# Patient Record
Sex: Male | Born: 1955 | Race: Black or African American | Hispanic: No | Marital: Married | Smoking: Former smoker
Health system: Southern US, Community
[De-identification: ages and names within clinical notes are randomized; demographics above are authoritative.]

## PROBLEM LIST (undated history)

## (undated) DIAGNOSIS — Z8673 Personal history of transient ischemic attack (TIA), and cerebral infarction without residual deficits: Secondary | ICD-10-CM

## (undated) DIAGNOSIS — Z87898 Personal history of other specified conditions: Secondary | ICD-10-CM

## (undated) DIAGNOSIS — M109 Gout, unspecified: Secondary | ICD-10-CM

## (undated) DIAGNOSIS — N182 Chronic kidney disease, stage 2 (mild): Secondary | ICD-10-CM

## (undated) DIAGNOSIS — R7303 Prediabetes: Secondary | ICD-10-CM

## (undated) DIAGNOSIS — Z86718 Personal history of other venous thrombosis and embolism: Secondary | ICD-10-CM

## (undated) HISTORY — DX: Gout, unspecified: M10.9

## (undated) HISTORY — DX: Personal history of other specified conditions: Z87.898

## (undated) HISTORY — PX: BIOPSY PROSTATE: PRO28

## (undated) HISTORY — DX: Personal history of transient ischemic attack (TIA), and cerebral infarction without residual deficits: Z86.73

## (undated) HISTORY — DX: Chronic kidney disease, stage 2 (mild): N18.2

## (undated) HISTORY — DX: Personal history of other venous thrombosis and embolism: Z86.718

## (undated) HISTORY — DX: Prediabetes: R73.03

---

## 2005-05-26 HISTORY — PX: COLONOSCOPY: SHX174

## 2005-05-26 LAB — HM COLONOSCOPY: HM Colonoscopy: NORMAL

## 2013-05-26 LAB — PSA: PSA: NORMAL

## 2015-02-07 ENCOUNTER — Ambulatory Visit (INDEPENDENT_AMBULATORY_CARE_PROVIDER_SITE_OTHER): Payer: 59

## 2015-02-07 DIAGNOSIS — Z111 Encounter for screening for respiratory tuberculosis: Secondary | ICD-10-CM

## 2015-02-07 MED ORDER — TUBERCULIN PPD 5 UNIT/0.1ML ID SOLN
5.0000 [IU] | Freq: Once | INTRADERMAL | Status: DC
  Administered 2015-02-07: 5 [IU] via INTRADERMAL

## 2015-02-12 ENCOUNTER — Ambulatory Visit (INDEPENDENT_AMBULATORY_CARE_PROVIDER_SITE_OTHER): Payer: 59

## 2015-02-12 DIAGNOSIS — Z111 Encounter for screening for respiratory tuberculosis: Secondary | ICD-10-CM | POA: Diagnosis not present

## 2015-02-14 ENCOUNTER — Ambulatory Visit: Payer: 59

## 2015-06-06 ENCOUNTER — Encounter: Payer: Self-pay | Admitting: Family Medicine

## 2015-06-06 ENCOUNTER — Ambulatory Visit (INDEPENDENT_AMBULATORY_CARE_PROVIDER_SITE_OTHER): Payer: BC Managed Care – PPO | Admitting: Family Medicine

## 2015-06-06 ENCOUNTER — Telehealth: Payer: Self-pay | Admitting: Family Medicine

## 2015-06-06 VITALS — BP 118/78 | HR 76 | Temp 98.2°F | Resp 16 | Ht 76.0 in | Wt 238.0 lb

## 2015-06-06 DIAGNOSIS — Z13220 Encounter for screening for lipoid disorders: Secondary | ICD-10-CM

## 2015-06-06 DIAGNOSIS — R361 Hematospermia: Secondary | ICD-10-CM | POA: Diagnosis not present

## 2015-06-06 DIAGNOSIS — Z87898 Personal history of other specified conditions: Secondary | ICD-10-CM | POA: Diagnosis not present

## 2015-06-06 DIAGNOSIS — Z1159 Encounter for screening for other viral diseases: Secondary | ICD-10-CM

## 2015-06-06 DIAGNOSIS — N182 Chronic kidney disease, stage 2 (mild): Secondary | ICD-10-CM

## 2015-06-06 DIAGNOSIS — R972 Elevated prostate specific antigen [PSA]: Secondary | ICD-10-CM

## 2015-06-06 DIAGNOSIS — R7303 Prediabetes: Secondary | ICD-10-CM | POA: Diagnosis not present

## 2015-06-06 DIAGNOSIS — Z86718 Personal history of other venous thrombosis and embolism: Secondary | ICD-10-CM | POA: Insufficient documentation

## 2015-06-06 DIAGNOSIS — Z8673 Personal history of transient ischemic attack (TIA), and cerebral infarction without residual deficits: Secondary | ICD-10-CM | POA: Insufficient documentation

## 2015-06-06 DIAGNOSIS — Z23 Encounter for immunization: Secondary | ICD-10-CM

## 2015-06-06 DIAGNOSIS — Z79899 Other long term (current) drug therapy: Secondary | ICD-10-CM

## 2015-06-06 LAB — POCT URINALYSIS DIPSTICK
BILIRUBIN UA: NEGATIVE
Glucose, UA: NEGATIVE
KETONES UA: NEGATIVE
LEUKOCYTES UA: NEGATIVE
NITRITE UA: NEGATIVE
PH UA: 5
PROTEIN UA: NEGATIVE
Spec Grav, UA: 1.01
Urobilinogen, UA: 0.2

## 2015-06-06 NOTE — Progress Notes (Signed)
Name: Nathaniel [name]   MRN: [id number]    DOB: [birth year]   Date:06/06/2015       Progress Note  Subjective  Chief Complaint  Chief Complaint  Patient presents with  . Advice Only    patient stated that he noticed that when he ejaculates there is dark red blood. no pain, no burning, no swelling.    HPI  Blood in semen: he states he noticed some specks of blood mixed in his semen since Saturday, every time he has ejaculated since ( total of 3 times ), no pain, no discomfort, no trauma. He takes aspirin and plavix because of history of TIA, but denies any bleeding in other areas.   Prediabetes : he denies polyphagia, polyuria or polyria   Patient Active Problem List   Diagnosis Date Noted  . CKD (chronic kidney disease) stage 2, GFR 60-89 ml/min 06/06/2015  . Prediabetes 06/06/2015  . History of TIA (transient ischemic attack) 06/06/2015  . History of elevated PSA   . History of DVT (deep vein thrombosis)     History reviewed. No pertinent past surgical history.  Family History  Problem Relation Age of Onset  . Kidney disease Mother     Dialysis  . Cancer Mother     Breast  . Diabetes Father   . Cancer Father 43    Prostate    Social History   Social History  . Marital Status: Married    Spouse Name: N/A  . Number of Children: N/A  . Years of Education: N/A   Occupational History  . Not on file.   Social History Main Topics  . Smoking status: Former Games developer  . Smokeless tobacco: Not on file  . Alcohol Use: 0.0 oz/week    0 Standard drinks or equivalent per week  . Drug Use: No  . Sexual Activity:    Partners: Female   Other Topics Concern  . Not on file   Social History Narrative     Current outpatient prescriptions:  .  aspirin 81 MG chewable tablet, Chew by mouth., Disp: , Rfl:  .  clopidogrel (PLAVIX) 75 MG tablet, Take by mouth., Disp: , Rfl:   No Known Allergies   ROS  Constitutional: Negative for fever or weight change.  Respiratory:  Negative for cough and shortness of breath.   Cardiovascular: Negative for chest pain or palpitations.  Gastrointestinal: Negative for abdominal pain, no bowel changes.  Musculoskeletal: Negative for gait problem or joint swelling.  Skin: Negative for rash.  Neurological: Negative for dizziness or headache.  No other specific complaints in a complete review of systems (except as listed in HPI above).  Objective  Filed Vitals:   06/06/15 1152  BP: 118/78  Pulse: 76  Temp: 98.2 F (36.8 C)  TempSrc: Oral  Resp: 16  Height: 6\' 4"  (1.93 m)  Weight: 238 lb (107.956 kg)  SpO2: 97%    Body mass index is 28.98 kg/(m^2).  Physical Exam  Constitutional: Patient appears well-developed and well-nourished. No distress.  HEENT: head atraumatic, normocephalic, pupils equal and reactive to light, neck supple, throat within normal limits Cardiovascular: Normal rate, regular rhythm and normal heart sounds.  No murmur heard. No BLE edema. Pulmonary/Chest: Effort normal and breath sounds normal. No respiratory distress. Abdominal: Soft.  There is no tenderness. Psychiatric: Patient has a normal mood and affect. behavior is normal. Judgment and thought content normal. GU: normal testicular and penile exam, prostate exam not done, referred to Urologist  PHQ2/9: Depression  screen PHQ 2/9 06/06/2015  Decreased Interest 0  Down, Depressed, Hopeless 0  PHQ - 2 Score 0    Fall Risk: Fall Risk  06/06/2015  Falls in the past year? Yes  Number falls in past yr: 1  Injury with Fall? Yes     Functional Status Survey: Is the patient deaf or have difficulty hearing?: No Does the patient have difficulty seeing, even when wearing glasses/contacts?: No Does the patient have difficulty concentrating, remembering, or making decisions?: No Does the patient have difficulty walking or climbing stairs?: No Does the patient have difficulty dressing or bathing?: No Does the patient have difficulty doing  errands alone such as visiting a doctor's office or shopping?: No    Assessment & Plan  1. Blood in semen  - CBC with Differential/Platelet - Ambulatory referral to Urology  2. Long-term use of high-risk medication  - CBC with Differential/Platelet  3. Needs flu shot  - Flu Vaccine QUAD 36+ mos IM  4. Need for Tdap vaccination  - Tdap vaccine greater than or equal to 7yo IM - he will get it during his CPE, out of stock  5. Need for shingles vaccine  - Varicella-zoster vaccine subcutaneous  - out of stock, he will get next time, out of stock today   6. History of elevated PSA  - PSA  7. Prediabetes  - Hemoglobin A1c  8. CKD (chronic kidney disease) stage 2, GFR 60-89 ml/min  - Comprehensive metabolic panel  9. Lipid screening  - Lipid panel  10. Need for hepatitis C screening test  - Hepatitis C antibody

## 2015-06-06 NOTE — Telephone Encounter (Signed)
Patient scheduled his annual cpe for Feb and he would like an order for his lab work.

## 2015-06-07 LAB — HEMOGLOBIN A1C
Est. average glucose Bld gHb Est-mCnc: 123 mg/dL
HEMOGLOBIN A1C: 5.9 % — AB (ref 4.8–5.6)

## 2015-06-07 LAB — CBC WITH DIFFERENTIAL/PLATELET
BASOS: 0 %
Basophils Absolute: 0 10*3/uL (ref 0.0–0.2)
EOS (ABSOLUTE): 0.1 10*3/uL (ref 0.0–0.4)
EOS: 3 %
HEMATOCRIT: 45.3 % (ref 37.5–51.0)
Hemoglobin: 15.6 g/dL (ref 12.6–17.7)
IMMATURE GRANS (ABS): 0 10*3/uL (ref 0.0–0.1)
Immature Granulocytes: 0 %
LYMPHS: 37 %
Lymphocytes Absolute: 1.4 10*3/uL (ref 0.7–3.1)
MCH: 30.4 pg (ref 26.6–33.0)
MCHC: 34.4 g/dL (ref 31.5–35.7)
MCV: 88 fL (ref 79–97)
Monocytes Absolute: 0.2 10*3/uL (ref 0.1–0.9)
Monocytes: 6 %
NEUTROS ABS: 2 10*3/uL (ref 1.4–7.0)
Neutrophils: 54 %
PLATELETS: 230 10*3/uL (ref 150–379)
RBC: 5.14 x10E6/uL (ref 4.14–5.80)
RDW: 13.7 % (ref 12.3–15.4)
WBC: 3.6 10*3/uL (ref 3.4–10.8)

## 2015-06-07 LAB — COMPREHENSIVE METABOLIC PANEL
A/G RATIO: 1.9 (ref 1.1–2.5)
ALBUMIN: 4.5 g/dL (ref 3.6–4.8)
ALT: 29 IU/L (ref 0–44)
AST: 27 IU/L (ref 0–40)
Alkaline Phosphatase: 75 IU/L (ref 39–117)
BUN / CREAT RATIO: 11 (ref 10–22)
BUN: 13 mg/dL (ref 8–27)
Bilirubin Total: 0.5 mg/dL (ref 0.0–1.2)
CALCIUM: 9.7 mg/dL (ref 8.6–10.2)
CO2: 26 mmol/L (ref 18–29)
CREATININE: 1.16 mg/dL (ref 0.76–1.27)
Chloride: 99 mmol/L (ref 96–106)
GFR, EST AFRICAN AMERICAN: 79 mL/min/{1.73_m2} (ref >59)
GFR, EST NON AFRICAN AMERICAN: 68 mL/min/{1.73_m2} (ref >59)
GLOBULIN, TOTAL: 2.4 g/dL (ref 1.5–4.5)
Glucose: 93 mg/dL (ref 65–99)
POTASSIUM: 4.6 mmol/L (ref 3.5–5.2)
SODIUM: 140 mmol/L (ref 134–144)
TOTAL PROTEIN: 6.9 g/dL (ref 6.0–8.5)

## 2015-06-07 LAB — HEPATITIS C ANTIBODY: Hep C Virus Ab: <0.1 s/co ratio (ref 0.0–0.9)

## 2015-06-07 LAB — LIPID PANEL
CHOL/HDL RATIO: 3.2 ratio (ref 0.0–5.0)
Cholesterol, Total: 186 mg/dL (ref 100–199)
HDL: 59 mg/dL (ref >39)
LDL CALC: 99 mg/dL (ref 0–99)
TRIGLYCERIDES: 141 mg/dL (ref 0–149)
VLDL Cholesterol Cal: 28 mg/dL (ref 5–40)

## 2015-06-07 LAB — PSA: Prostate Specific Ag, Serum: 2.2 ng/mL (ref 0.0–4.0)

## 2015-06-08 ENCOUNTER — Telehealth: Payer: Self-pay

## 2015-06-08 NOTE — Telephone Encounter (Signed)
Patient was informed of result and a eating plan for prediabetes was printed out for him to pick up at his convenience.

## 2015-06-11 ENCOUNTER — Encounter: Payer: Self-pay | Admitting: Urology

## 2015-06-11 ENCOUNTER — Ambulatory Visit (INDEPENDENT_AMBULATORY_CARE_PROVIDER_SITE_OTHER): Payer: BC Managed Care – PPO | Admitting: Urology

## 2015-06-11 VITALS — BP 118/77 | HR 69 | Ht 76.0 in | Wt 231.6 lb

## 2015-06-11 DIAGNOSIS — Z87898 Personal history of other specified conditions: Secondary | ICD-10-CM

## 2015-06-11 DIAGNOSIS — R361 Hematospermia: Secondary | ICD-10-CM | POA: Diagnosis not present

## 2015-06-11 LAB — URINALYSIS, COMPLETE
Bilirubin, UA: NEGATIVE
Glucose, UA: NEGATIVE
Ketones, UA: NEGATIVE
Leukocytes, UA: NEGATIVE
NITRITE UA: NEGATIVE
Protein, UA: NEGATIVE
Specific Gravity, UA: <1.005 — ABNORMAL LOW (ref 1.005–1.030)
UUROB: 0.2 mg/dL (ref 0.2–1.0)
pH, UA: 6 (ref 5.0–7.5)

## 2015-06-11 LAB — MICROSCOPIC EXAMINATION
Bacteria, UA: NONE SEEN
EPITHELIAL CELLS (NON RENAL): NONE SEEN /HPF (ref 0–10)
WBC UA: NONE SEEN /HPF (ref 0–?)

## 2015-06-11 NOTE — Progress Notes (Signed)
06/11/2015 10:32 AM   Nathaniel [name] Aug 30, 1955 [id number]  Referring provider: Edwena FeltyAshany Sundaram, MD [address] [place], [place] [id number]  Chief Complaint  Patient presents with  . New Patient (Initial Visit)    blood in semen, pt was seen x 4 yrs [place]    HPI:  1 - Hematospermia - pt wit blood in semen noted 05/2015. No new discharge, voiding complaints, GU trauma. No hematuria, non-smoker (UA w/o hematuria as well). DRE 30gm smooth, scrotal exam unremarkable. Now slowly resolving. He is on plavix for h/o DVT, TIA.  2 - Elevated PSA - s/p negative BX around 2012 for single elevated PSA, that in retrospect he states was drawn after intercourse. Pr's father with prosate cancer. Recent Screening: 05/2015 PSA 2.2 / DRE 30gm smooth.  PMH sig for DVT,TIA (now on plavix).   Today "Nathaniel CliffRicky" is seen as new patient for above.   PMH: Past Medical History  Diagnosis Date  . History of elevated PSA     [place] Prostate Biopsy Benign (Specialist wanted to do biopsies q6 months but patient did not think this was necessary and would rather monitor with annual PSA's)  . History of DVT (deep vein thrombosis)     DVT right lower extremity in 2003 on coumadin for 3 months, INR levels subtherapeutic, started having slurred words and memory issues, extensive TIA/CVA work up done, no residual deficits. Switched to asa plus plavix ever since and has done well thus far.  . CKD (chronic kidney disease) stage 2, GFR 60-89 ml/min   . CKD (chronic kidney disease) stage 2, GFR 60-89 ml/min   . Pre-diabetes   . History of TIA (transient ischemic attack)     Surgical History: Past Surgical History  Procedure Laterality Date  . No past surgeries    . Biopsy prostate  ?2012    neg    Home Medications:    Medication List       This list is accurate as of: 06/11/15 10:32 AM.  Always use your most recent med list.               aspirin 81 MG chewable tablet  Chew by mouth.     clopidogrel 75 MG tablet  Commonly known as:  PLAVIX  Take by mouth.        Allergies: No Known Allergies  Family History: Family History  Problem Relation Age of Onset  . Kidney disease Mother     Dialysis  . Cancer Mother     Breast  . Diabetes Father   . Cancer Father [phone]    Prostate    Social History:  reports that he quit smoking about 26 years ago. He does not have any smokeless tobacco history on file. He reports that he drinks alcohol. He reports that he does not use illicit drugs.  ROS: UROLOGY Frequent Urination?: No Hard to postpone urination?: No Burning/pain with urination?: No Get up at night to urinate?: Yes Leakage of urine?: No Urine stream starts and stops?: No Trouble starting stream?: No Do you have to strain to urinate?: No Blood in urine?: No Urinary tract infection?: No Sexually transmitted disease?: Yes (1979) Injury to kidneys or bladder?: No Painful intercourse?: No Weak stream?: No Erection problems?: Yes (sometimes) Penile pain?: No  Gastrointestinal Nausea?: No Vomiting?: No Indigestion/heartburn?: No Diarrhea?: No Constipation?: No  Constitutional Fever: No Night sweats?: No Weight loss?: No Fatigue?: No  Skin Skin rash/lesions?: No Itching?: No  Eyes Blurred vision?: No Double vision?: No  Ears/Nose/Throat Sore throat?: No Sinus problems?: No  Hematologic/Lymphatic Swollen glands?: No Easy bruising?: Yes  Cardiovascular Leg swelling?: No Chest pain?: No  Respiratory Cough?: No Shortness of breath?: No  Endocrine Excessive thirst?: No  Musculoskeletal Back pain?: Yes Joint pain?: No  Neurological Headaches?: No Dizziness?: No  Psychologic Depression?: No Anxiety?: No  Physical Exam: BP 118/77 mmHg  Pulse 69  Ht 6\' 4"  (1.93 m)  Wt 231 lb 9.6 oz (105.053 kg)  BMI 28.20 kg/m2  Constitutional:  Alert and oriented, No acute distress. HEENT:  [place] AT, moist mucus membranes.  Trachea midline, no masses. Cardiovascular: No clubbing, cyanosis, or edema. Respiratory: Normal respiratory effort, no increased work of breathing. GI: Abdomen is soft, nontender, nondistended, no abdominal masses GU: No CVA tenderness. No testes masses. DRE 30gm smooth. Circ'd, straight phallus.  Skin: No rashes, bruises or suspicious lesions. Lymph: No cervical or inguinal adenopathy. Neurologic: Grossly intact, no focal deficits, moving all 4 extremities. Psychiatric: Normal mood and affect.  Laboratory Data: Lab Results  Component Value Date   WBC 3.6 06/06/2015   HCT 45.3 06/06/2015   MCV 88 06/06/2015   PLT 230 06/06/2015    Lab Results  Component Value Date   CREATININE 1.16 06/06/2015    Lab Results  Component Value Date   PSA Normal 05/26/2013    No results found for: TESTOSTERONE  Lab Results  Component Value Date   HGBA1C 5.9* 06/06/2015    Urinalysis    Component Value Date/Time   BILIRUBINUR NEGATIVE 06/06/2015 1221   PROTEINUR NEGATIVE 06/06/2015 1221   UROBILINOGEN 0.2 06/06/2015 1221   NITRITE NEGATIVE 06/06/2015 1221   LEUKOCYTESUR Negative 06/06/2015 1221    Pertinent Imaging: none  Assessment & Plan:    1 - Hematospermia - pt reassured. Natural history (nearly always benign, sometimes recurrent) and abbreviated eval with UCX, scrotal US, PSA discussed. Also discussed that if recurrent / refractory more detail eval with CT, Cysto and daily finasteride may be appropriate.   PSA / DRE normal. UCX today and scrotal US on return, if normal, then prn management unless recurrent.   2 - Elevated PSA - up to date this year and compliant, certainly warrants continued annual screennig.   3 - RTC 3mos with scrotal US, then prn if stable.      Return in about 3 months (around 09/09/2015).  Nathaniel Ache, MD  [facility] [address] [place], [place] [address] [phone]

## 2015-06-12 ENCOUNTER — Other Ambulatory Visit: Payer: Self-pay | Admitting: Family Medicine

## 2015-06-12 DIAGNOSIS — N182 Chronic kidney disease, stage 2 (mild): Secondary | ICD-10-CM

## 2015-06-12 DIAGNOSIS — Z87898 Personal history of other specified conditions: Secondary | ICD-10-CM

## 2015-06-12 DIAGNOSIS — R7303 Prediabetes: Secondary | ICD-10-CM

## 2015-06-12 DIAGNOSIS — R5383 Other fatigue: Secondary | ICD-10-CM

## 2015-06-12 NOTE — Telephone Encounter (Signed)
Lab work printed out and ready for pick up, having them drawn fasting for at least 8 hrs.

## 2015-06-13 LAB — URINE CULTURE: Organism ID, Bacteria: NO GROWTH

## 2015-06-14 ENCOUNTER — Other Ambulatory Visit: Payer: Self-pay

## 2015-06-14 DIAGNOSIS — R361 Hematospermia: Secondary | ICD-10-CM

## 2015-07-03 ENCOUNTER — Encounter: Payer: BC Managed Care – PPO | Admitting: Family Medicine

## 2015-07-05 ENCOUNTER — Encounter: Payer: Self-pay | Admitting: Family Medicine

## 2015-07-05 LAB — CBC WITH DIFFERENTIAL/PLATELET
BASOS ABS: 0 10*3/uL (ref 0.0–0.2)
BASOS: 1 %
EOS (ABSOLUTE): 0.1 10*3/uL (ref 0.0–0.4)
Eos: 4 %
HEMOGLOBIN: 14.8 g/dL (ref 12.6–17.7)
Hematocrit: 43.3 % (ref 37.5–51.0)
IMMATURE GRANS (ABS): 0 10*3/uL (ref 0.0–0.1)
IMMATURE GRANULOCYTES: 0 %
LYMPHS: 40 %
Lymphocytes Absolute: 1.5 10*3/uL (ref 0.7–3.1)
MCH: 29.4 pg (ref 26.6–33.0)
MCHC: 34.2 g/dL (ref 31.5–35.7)
MCV: 86 fL (ref 79–97)
MONOCYTES: 7 %
Monocytes Absolute: 0.2 10*3/uL (ref 0.1–0.9)
NEUTROS PCT: 48 %
Neutrophils Absolute: 1.8 10*3/uL (ref 1.4–7.0)
PLATELETS: 218 10*3/uL (ref 150–379)
RBC: 5.04 x10E6/uL (ref 4.14–5.80)
RDW: 14.6 % (ref 12.3–15.4)
WBC: 3.7 10*3/uL (ref 3.4–10.8)

## 2015-07-05 LAB — LIPID PANEL
CHOLESTEROL TOTAL: 173 mg/dL (ref 100–199)
Chol/HDL Ratio: 2.7 ratio units (ref 0.0–5.0)
HDL: 64 mg/dL (ref >39)
LDL CALC: 94 mg/dL (ref 0–99)
TRIGLYCERIDES: 74 mg/dL (ref 0–149)
VLDL CHOLESTEROL CAL: 15 mg/dL (ref 5–40)

## 2015-07-05 LAB — COMPREHENSIVE METABOLIC PANEL
ALBUMIN: 4.4 g/dL (ref 3.6–4.8)
ALT: 25 IU/L (ref 0–44)
AST: 32 IU/L (ref 0–40)
Albumin/Globulin Ratio: 1.8 (ref 1.1–2.5)
Alkaline Phosphatase: 70 IU/L (ref 39–117)
BUN/Creatinine Ratio: 10 (ref 10–22)
BUN: 12 mg/dL (ref 8–27)
Bilirubin Total: 0.8 mg/dL (ref 0.0–1.2)
CALCIUM: 9.7 mg/dL (ref 8.6–10.2)
CO2: 25 mmol/L (ref 18–29)
CREATININE: 1.22 mg/dL (ref 0.76–1.27)
Chloride: 103 mmol/L (ref 96–106)
GFR, EST AFRICAN AMERICAN: 74 mL/min/{1.73_m2} (ref >59)
GFR, EST NON AFRICAN AMERICAN: 64 mL/min/{1.73_m2} (ref >59)
GLUCOSE: 91 mg/dL (ref 65–99)
Globulin, Total: 2.5 g/dL (ref 1.5–4.5)
Potassium: 4.9 mmol/L (ref 3.5–5.2)
Sodium: 143 mmol/L (ref 134–144)
TOTAL PROTEIN: 6.9 g/dL (ref 6.0–8.5)

## 2015-07-05 LAB — HEMOGLOBIN A1C
Est. average glucose Bld gHb Est-mCnc: 120 mg/dL
Hgb A1c MFr Bld: 5.8 % — ABNORMAL HIGH (ref 4.8–5.6)

## 2015-07-05 LAB — TSH: TSH: 2.18 u[IU]/mL (ref 0.450–4.500)

## 2015-07-05 LAB — PSA: Prostate Specific Ag, Serum: 7 ng/mL — ABNORMAL HIGH (ref 0.0–4.0)

## 2015-07-06 ENCOUNTER — Ambulatory Visit (INDEPENDENT_AMBULATORY_CARE_PROVIDER_SITE_OTHER): Payer: BC Managed Care – PPO | Admitting: Family Medicine

## 2015-07-06 ENCOUNTER — Encounter: Payer: Self-pay | Admitting: Family Medicine

## 2015-07-06 VITALS — BP 132/80 | HR 69 | Temp 97.7°F | Resp 14 | Ht 76.0 in | Wt 232.7 lb

## 2015-07-06 DIAGNOSIS — M545 Low back pain, unspecified: Secondary | ICD-10-CM | POA: Insufficient documentation

## 2015-07-06 DIAGNOSIS — R972 Elevated prostate specific antigen [PSA]: Secondary | ICD-10-CM | POA: Diagnosis not present

## 2015-07-06 DIAGNOSIS — Z1211 Encounter for screening for malignant neoplasm of colon: Secondary | ICD-10-CM | POA: Insufficient documentation

## 2015-07-06 DIAGNOSIS — Z23 Encounter for immunization: Secondary | ICD-10-CM | POA: Insufficient documentation

## 2015-07-06 DIAGNOSIS — Z0000 Encounter for general adult medical examination without abnormal findings: Secondary | ICD-10-CM | POA: Diagnosis not present

## 2015-07-06 DIAGNOSIS — Z8673 Personal history of transient ischemic attack (TIA), and cerebral infarction without residual deficits: Secondary | ICD-10-CM | POA: Diagnosis not present

## 2015-07-06 DIAGNOSIS — Z7189 Other specified counseling: Secondary | ICD-10-CM | POA: Insufficient documentation

## 2015-07-06 MED ORDER — CLOPIDOGREL BISULFATE 75 MG PO TABS: 75.0000 mg | ORAL_TABLET | Freq: Every day | ORAL | Status: DC

## 2015-07-06 NOTE — Progress Notes (Signed)
Name: Nathaniel [name]   MRN: [id number]    DOB: 1955/12/30   Date:07/06/2015       Progress Note  Subjective  Chief Complaint  Chief Complaint  Patient presents with  . Annual Exam    HPI  Patient is here today for a Complete Male Physical Exam:  The patient has no acute symptoms but would like to review recent lab work. Overall feels healthy. Diet is well balanced. In general does exercise regularly. Sees dentist regularly and addresses vision concerns with ophthalmologist if applicable. In regards to sexual activity the patient is currently sexually active. Currently is not concerned about exposure to any STDs.   Mr. Fahs did have elevated PSA on lab work done 07/04/15 as a part of his annual physical. Around 2012 he did have a similar single elevation of PSA and a prostate biopsy was negative at that time. He recently consulted with local urology specialist regarding hematospermia and PSA on 06/06/15 was 2.2. He did have a DRE that day on 06/01/15 which was normal per review of notes. Sexual intercourse prior to blood draw. Denies any current dysuria, change in urinary stream, ongoing issues with hematospermia.   Otherwise needs refill of Plavix for history of DVT/TIA.   Past Medical History  Diagnosis Date  . History of elevated PSA     Has seen Urologist in [place]: Prostate Biopsy Benign (Specialist wanted to do biopsies q6 months but patient did not think this was necessary and would rather monitor with annual PSA's)  . History of DVT (deep vein thrombosis)     DVT right lower extremity in 2003 on coumadin for 3 months, INR levels subtherapeutic, started having slurred words and memory issues, extensive TIA/CVA work up done, no residual deficits. Switched to asa plus plavix ever since and has done well thus far.  . CKD (chronic kidney disease) stage 2, GFR 60-89 ml/min   . CKD (chronic kidney disease) stage 2, GFR 60-89 ml/min   . Pre-diabetes   . History of TIA (transient ischemic attack)      Past Surgical History  Procedure Laterality Date  . No past surgeries    . Biopsy prostate  ?2012    neg    Family History  Problem Relation Age of Onset  . Kidney disease Mother     Dialysis  . Cancer Mother     Breast  . Diabetes Father   . Cancer Father 74    Prostate    Social History   Social History  . Marital Status: Married    Spouse Name: N/A  . Number of Children: N/A  . Years of Education: N/A   Occupational History  . Not on file.   Social History Main Topics  . Smoking status: Former Smoker -- 3 years    Quit date: 06/10/1989  . Smokeless tobacco: Not on file  . Alcohol Use: 0.0 oz/week    0 Standard drinks or equivalent per week  . Drug Use: No  . Sexual Activity:    Partners: Female   Other Topics Concern  . Not on file   Social History Narrative     Current outpatient prescriptions:  .  aspirin 81 MG chewable tablet, Chew by mouth., Disp: , Rfl:  .  clopidogrel (PLAVIX) 75 MG tablet, Take by mouth., Disp: , Rfl:   No Known Allergies  ROS  CONSTITUTIONAL: No significant weight changes, fever, chills, weakness or fatigue.  HEENT:  - Eyes: No visual changes.  -  Ears: No auditory changes. No pain.  - Nose: No sneezing, congestion, runny nose. - Throat: No sore throat. No changes in swallowing. SKIN: No rash or itching.  CARDIOVASCULAR: No chest pain, chest pressure or chest discomfort. No palpitations or edema.  RESPIRATORY: No shortness of breath, cough or sputum.  GASTROINTESTINAL: No anorexia, nausea, vomiting. No changes in bowel habits. No abdominal pain or blood.  GENITOURINARY: No dysuria. No frequency. No discharge.  NEUROLOGICAL: No headache, dizziness, syncope, paralysis, ataxia, numbness or tingling in the extremities. No memory changes. No change in bowel or bladder control.  MUSCULOSKELETAL: No joint pain. No muscle pain. HEMATOLOGIC: No anemia, bleeding or bruising.  LYMPHATICS: No enlarged lymph nodes.  PSYCHIATRIC:  No change in mood. No change in sleep pattern.  ENDOCRINOLOGIC: No reports of sweating, cold or heat intolerance. No polyuria or polydipsia.   Objective  Filed Vitals:   07/06/15 1412  BP: 132/80  Pulse: 69  Temp: 97.7 F (36.5 C)  TempSrc: Oral  Resp: 14  Height:  (1.93 m)  Weight: 232 lb 11.2 oz (105.552 kg)  SpO2: 94%   Body mass index is 28.34 kg/(m^2).  Depression screen [facility] 2/9 06/06/2015  Decreased Interest 0  Down, Depressed, Hopeless 0  PHQ - 2 Score 0    Recent Results (from the past 2160 hour(s))  POCT Urinalysis Dipstick     Status: Normal   Collection Time: 06/06/15 12:21 PM  Result Value Ref Range   Color, UA YELLOW    Clarity, UA CLEAR    Glucose, UA NEGATIVE    Bilirubin, UA NEGATIVE    Ketones, UA NEGATIVE    Spec Grav, UA 1.010    Blood, UA HEMOLYZED TRACE    pH, UA 5.0    Protein, UA NEGATIVE    Urobilinogen, UA 0.2    Nitrite, UA NEGATIVE    Leukocytes, UA Negative Negative  CBC with Differential/Platelet     Status: None   Collection Time: 06/06/15 12:41 PM  Result Value Ref Range   WBC 3.6 3.4 - 10.8 x10E3/uL   RBC 5.14 4.14 - 5.80 x10E6/uL   Hemoglobin 15.6 12.6 - 17.7 g/dL   Hematocrit 40.9 81.1 - 51.0 %   MCV 88 79 - 97 fL   MCH 30.4 26.6 - 33.0 pg   MCHC 34.4 31.5 - 35.7 g/dL   RDW 91.4 78.2 - 95.6 %   Platelets 230 150 - 379 x10E3/uL   Neutrophils 54 %   Lymphs 37 %   Monocytes 6 %   Eos 3 %   Basos 0 %   Neutrophils Absolute 2.0 1.4 - 7.0 x10E3/uL   Lymphocytes Absolute 1.4 0.7 - 3.1 x10E3/uL   Monocytes Absolute 0.2 0.1 - 0.9 x10E3/uL   EOS (ABSOLUTE) 0.1 0.0 - 0.4 x10E3/uL   Basophils Absolute 0.0 0.0 - 0.2 x10E3/uL   Immature Granulocytes 0 %   Immature Grans (Abs) 0.0 0.0 - 0.1 x10E3/uL  Comprehensive metabolic panel     Status: None   Collection Time: 06/06/15 12:41 PM  Result Value Ref Range   Glucose 93 65 - 99 mg/dL   BUN 13 8 - 27 mg/dL   Creatinine, Ser 2.13 0.76 - 1.27 mg/dL   GFR calc non Af Amer 68 >59  mL/min/1.73   GFR calc Af Amer 79 >59 mL/min/1.73   BUN/Creatinine Ratio 11 10 - 22   Sodium 140 134 - 144 mmol/L   Potassium 4.6 3.5 - 5.2 mmol/L   Chloride 99  96 - 106 mmol/L   CO2 26 18 - 29 mmol/L   Calcium 9.7 8.6 - 10.2 mg/dL   Total Protein 6.9 6.0 - 8.5 g/dL   Albumin 4.5 3.6 - 4.8 g/dL   Globulin, Total 2.4 1.5 - 4.5 g/dL   Albumin/Globulin Ratio 1.9 1.1 - 2.5   Bilirubin Total 0.5 0.0 - 1.2 mg/dL   Alkaline Phosphatase 75 39 - 117 IU/L   AST 27 0 - 40 IU/L   ALT 29 0 - 44 IU/L  Lipid panel     Status: None   Collection Time: 06/06/15 12:41 PM  Result Value Ref Range   Cholesterol, Total 186 100 - 199 mg/dL   Triglycerides 409 0 - 149 mg/dL   HDL 59 >81 mg/dL   VLDL Cholesterol Cal 28 5 - 40 mg/dL   LDL Calculated 99 0 - 99 mg/dL   Chol/HDL Ratio 3.2 0.0 - 5.0 ratio units    Comment:                                   T. Chol/HDL Ratio                                             Men  Women                               1/2 Avg.Risk  3.4    3.3                                   Avg.Risk  5.0    4.4                                2X Avg.Risk  9.6    7.1                                3X Avg.Risk 23.4   11.0   Hemoglobin A1c     Status: Abnormal   Collection Time: 06/06/15 12:41 PM  Result Value Ref Range   Hgb A1c MFr Bld 5.9 (H) 4.8 - 5.6 %    Comment:          Pre-diabetes: 5.7 - 6.4          Diabetes: >6.4          Glycemic control for adults with diabetes: <7.0    Est. average glucose Bld gHb Est-mCnc 123 mg/dL  PSA     Status: None   Collection Time: 06/06/15 12:41 PM  Result Value Ref Range   Prostate Specific Ag, Serum 2.2 0.0 - 4.0 ng/mL    Comment: Roche ECLIA methodology. According to the American Urological Association, Serum PSA should decrease and remain at undetectable levels after radical prostatectomy. The AUA defines biochemical recurrence as an initial PSA value 0.2 ng/mL or greater followed by a subsequent confirmatory PSA value 0.2 ng/mL or  greater. Values obtained with different assay methods or kits cannot be used interchangeably. Results cannot be interpreted as absolute evidence of the presence or absence of  malignant disease.   Hepatitis C antibody     Status: None   Collection Time: 06/06/15 12:41 PM  Result Value Ref Range   Hep C Virus Ab <0.1 0.0 - 0.9 s/co ratio    Comment:                                   Negative:     < 0.8                              Indeterminate: 0.8 - 0.9                                   Positive:     > 0.9  The CDC recommends that a positive HCV antibody result  be followed up with a HCV Nucleic Acid Amplification  test (161096).   Urinalysis, Complete     Status: Abnormal   Collection Time: 06/11/15 10:13 AM  Result Value Ref Range   Specific Gravity, UA <1.005 (L) 1.005 - 1.030   pH, UA 6.0 5.0 - 7.5   Color, UA Yellow Yellow   Appearance Ur Clear Clear   Leukocytes, UA Negative Negative   Protein, UA Negative Negative/Trace   Glucose, UA Negative Negative   Ketones, UA Negative Negative   RBC, UA 1+ (A) Negative   Bilirubin, UA Negative Negative   Urobilinogen, Ur 0.2 0.2 - 1.0 mg/dL   Nitrite, UA Negative Negative   Microscopic Examination See below:   Microscopic Examination     Status: None   Collection Time: 06/11/15 10:13 AM  Result Value Ref Range   WBC, UA None seen 0 -  5 /hpf   RBC, UA 0-2 0 -  2 /hpf   Epithelial Cells (non renal) None seen 0 - 10 /hpf   Bacteria, UA None seen None seen/Few  Urine culture     Status: None   Collection Time: 06/11/15 10:40 AM  Result Value Ref Range   Urine Culture, Routine Final report    Urine Culture result 1 No growth   CBC with Differential/Platelet     Status: None   Collection Time: 07/04/15  9:37 AM  Result Value Ref Range   WBC 3.7 3.4 - 10.8 x10E3/uL   RBC 5.04 4.14 - 5.80 x10E6/uL   Hemoglobin 14.8 12.6 - 17.7 g/dL   Hematocrit 04.5 40.9 - 51.0 %   MCV 86 79 - 97 fL   MCH 29.4 26.6 - 33.0 pg   MCHC 34.2 31.5  - 35.7 g/dL   RDW 81.1 91.4 - 78.2 %   Platelets 218 150 - 379 x10E3/uL   Neutrophils 48 %   Lymphs 40 %   Monocytes 7 %   Eos 4 %   Basos 1 %   Neutrophils Absolute 1.8 1.4 - 7.0 x10E3/uL   Lymphocytes Absolute 1.5 0.7 - 3.1 x10E3/uL   Monocytes Absolute 0.2 0.1 - 0.9 x10E3/uL   EOS (ABSOLUTE) 0.1 0.0 - 0.4 x10E3/uL   Basophils Absolute 0.0 0.0 - 0.2 x10E3/uL   Immature Granulocytes 0 %   Immature Grans (Abs) 0.0 0.0 - 0.1 x10E3/uL  Comprehensive metabolic panel     Status: None   Collection Time: 07/04/15  9:37 AM  Result Value Ref Range   Glucose 91 65 -  99 mg/dL   BUN 12 8 - 27 mg/dL   Creatinine, Ser 1.61 0.76 - 1.27 mg/dL   GFR calc non Af Amer 64 >59 mL/min/1.73   GFR calc Af Amer 74 >59 mL/min/1.73   BUN/Creatinine Ratio 10 10 - 22   Sodium 143 134 - 144 mmol/L   Potassium 4.9 3.5 - 5.2 mmol/L   Chloride 103 96 - 106 mmol/L   CO2 25 18 - 29 mmol/L   Calcium 9.7 8.6 - 10.2 mg/dL   Total Protein 6.9 6.0 - 8.5 g/dL   Albumin 4.4 3.6 - 4.8 g/dL   Globulin, Total 2.5 1.5 - 4.5 g/dL   Albumin/Globulin Ratio 1.8 1.1 - 2.5   Bilirubin Total 0.8 0.0 - 1.2 mg/dL   Alkaline Phosphatase 70 39 - 117 IU/L   AST 32 0 - 40 IU/L   ALT 25 0 - 44 IU/L  Hemoglobin A1c     Status: Abnormal   Collection Time: 07/04/15  9:37 AM  Result Value Ref Range   Hgb A1c MFr Bld 5.8 (H) 4.8 - 5.6 %    Comment:          Pre-diabetes: 5.7 - 6.4          Diabetes: >6.4          Glycemic control for adults with diabetes: <7.0    Est. average glucose Bld gHb Est-mCnc 120 mg/dL  Lipid panel     Status: None   Collection Time: 07/04/15  9:37 AM  Result Value Ref Range   Cholesterol, Total 173 100 - 199 mg/dL   Triglycerides 74 0 - 149 mg/dL   HDL 64 >09 mg/dL   VLDL Cholesterol Cal 15 5 - 40 mg/dL   LDL Calculated 94 0 - 99 mg/dL   Chol/HDL Ratio 2.7 0.0 - 5.0 ratio units    Comment:                                   T. Chol/HDL Ratio                                             Men  Women                                1/2 Avg.Risk  3.4    3.3                                   Avg.Risk  5.0    4.4                                2X Avg.Risk  9.6    7.1                                3X Avg.Risk 23.4   11.0   PSA     Status: Abnormal   Collection Time: 07/04/15  9:37 AM  Result Value Ref Range   Prostate Specific Ag, Serum 7.0 (H) 0.0 - 4.0 ng/mL    Comment: Roche  ECLIA methodology. According to the American Urological Association, Serum PSA should decrease and remain at undetectable levels after radical prostatectomy. The AUA defines biochemical recurrence as an initial PSA value 0.2 ng/mL or greater followed by a subsequent confirmatory PSA value 0.2 ng/mL or greater. Values obtained with different assay methods or kits cannot be used interchangeably. Results cannot be interpreted as absolute evidence of the presence or absence of malignant disease.   TSH     Status: None   Collection Time: 07/04/15  9:37 AM  Result Value Ref Range   TSH 2.180 0.450 - 4.500 uIU/mL    Physical Exam  Constitutional: Patient appears well-developed and well-nourished. In no distress.  HEENT:  - Head: Normocephalic and atraumatic.  - Ears: Bilateral TMs gray, no erythema or effusion - Nose: Nasal mucosa moist - Mouth/Throat: Oropharynx is clear and moist. No tonsillar hypertrophy or erythema. No post nasal drainage.  - Eyes: Conjunctivae clear, EOM movements normal. PERRLA. No scleral icterus.  Neck: Normal range of motion. Neck supple. No JVD present. No thyromegaly present.  Cardiovascular: Normal rate, regular rhythm and normal heart sounds.  No murmur heard.  Pulmonary/Chest: Effort normal and breath sounds normal. No respiratory distress. Abdominal: Soft. Bowel sounds are normal, no distension. There is no tenderness. no masses BREAST: Bilateral breast exam normal with no masses, skin changes or nipple discharge MALE GENITALIA: Bilateral testes descended with no masses, no penile  lesions, no penile discharge. PROSTATE: Deferred  Musculoskeletal: Normal range of motion bilateral UE and LE, no joint effusions. Peripheral vascular: Bilateral LE no edema. Neurological: CN II-XII grossly intact with no focal deficits. Alert and oriented to person, place, and time. Coordination, balance, strength, speech and gait are normal.  Skin: Skin is warm and dry. No rash noted. No erythema.  Psychiatric: Patient has a normal mood and affect. Behavior is normal in office today. Judgment and thought content normal in office today.    Assessment & Plan  1. Annual physical exam Discussed in detail all recommended preventative measures appropriate for age and gender now and in the future.  2. Elevated PSA, less than 10 ng/ml Discussed findings of elevated PSA, will repeat PSA in 1 week to assess for changes. If no resolution recommended consultation with Urologist sooner than later.  - PSA  3. Encounter for screening for malignant neoplasm of colon He will do the Cologuard testing.  - Cologuard  4. Need for Tdap vaccination  - Tdap vaccine greater than or equal to 7yo IM  5. History of TIA (transient ischemic attack) Refilled  - clopidogrel (PLAVIX) 75 MG tablet; Take 1 tablet (75 mg total) by mouth daily.  Dispense: 90 tablet; Refill: 3

## 2015-07-10 ENCOUNTER — Other Ambulatory Visit: Payer: Self-pay

## 2015-07-10 DIAGNOSIS — Z8673 Personal history of transient ischemic attack (TIA), and cerebral infarction without residual deficits: Secondary | ICD-10-CM

## 2015-07-10 MED ORDER — CLOPIDOGREL BISULFATE 75 MG PO TABS: 75.0000 mg | ORAL_TABLET | Freq: Every day | ORAL | Status: DC

## 2015-07-11 LAB — PSA: PROSTATE SPECIFIC AG, SERUM: 8.6 ng/mL — AB (ref 0.0–4.0)

## 2015-07-19 ENCOUNTER — Ambulatory Visit (INDEPENDENT_AMBULATORY_CARE_PROVIDER_SITE_OTHER): Payer: BC Managed Care – PPO | Admitting: Urology

## 2015-07-19 ENCOUNTER — Encounter: Payer: Self-pay | Admitting: Urology

## 2015-07-19 VITALS — BP 133/80 | HR 69 | Ht 76.0 in | Wt 234.3 lb

## 2015-07-19 DIAGNOSIS — R972 Elevated prostate specific antigen [PSA]: Secondary | ICD-10-CM

## 2015-07-19 DIAGNOSIS — N529 Male erectile dysfunction, unspecified: Secondary | ICD-10-CM

## 2015-07-19 DIAGNOSIS — R361 Hematospermia: Secondary | ICD-10-CM

## 2015-07-19 MED ORDER — CIPROFLOXACIN HCL 500 MG PO TABS: 500.0000 mg | ORAL_TABLET | Freq: Two times a day (BID) | ORAL | Status: DC

## 2015-07-19 MED ORDER — SILDENAFIL CITRATE 20 MG PO TABS: 20.0000 mg | ORAL_TABLET | ORAL | Status: DC

## 2015-07-19 NOTE — Progress Notes (Signed)
07/19/2015 10:13 AM   Nathaniel [name] Oct 23, 1955 [id number]  Referring provider: Edwena Felty, MD [address] [place], [place] [address]  Chief Complaint  Patient presents with  . Elevated PSA    HPI: 1 - Hematospermia - pt wit blood in semen noted 05/2015. This has since resolved.  2 - Elevated PSA - s/p negative BX around 2012 for single elevated PSA, that in retrospect he states was drawn after intercourse. Pt's father with prosate cancer. Recent Screening: 05/2015 PSA 2.2 / DRE 30gm smooth. However, his PCP rechecked his PSA in February 2017 twice and it was 7.0 followed by 8.6. He denies any urinary tract infections or burning with urination. He denies any other changes to his urinary gastrointestinal system.  3. Erectile dysfunction - the patient reports not be able to always maintain an erection to complete intercourse. He is ordered 100 mg of generic Viagra from [place] out[place] pharmacy [place]. This is working well for him. He is interested in obtaining generic sildenafil this time.   PMH: Past Medical History  Diagnosis Date  . History of elevated PSA     Has seen Urologist in [place]: Prostate Biopsy Benign (Specialist wanted to do biopsies q6 months but patient did not think this was necessary and would rather monitor with annual PSA's)  . History of DVT (deep vein thrombosis)     DVT right lower extremity in 2003 on coumadin for 3 months, INR levels subtherapeutic, started having slurred words and memory issues, extensive TIA/CVA work up done, no residual deficits. Switched to asa plus plavix ever since and has done well thus far.  . CKD (chronic kidney disease) stage 2, GFR 60-89 ml/min   . CKD (chronic kidney disease) stage 2, GFR 60-89 ml/min   . Pre-diabetes   . History of TIA (transient ischemic attack)     Surgical History: Past Surgical History  Procedure Laterality Date  . No past surgeries    . Biopsy prostate  ?2012    neg     Home Medications:    Medication List       This list is accurate as of: 07/19/15 10:13 AM.  Always use your most recent med list.               aspirin 81 MG chewable tablet  Chew by mouth.     ciprofloxacin 500 MG tablet  Commonly known as:  CIPRO  Take 1 tablet (500 mg total) by mouth every 12 (twelve) hours.     clopidogrel 75 MG tablet  Commonly known as:  PLAVIX  Take 1 tablet (75 mg total) by mouth daily.     sildenafil 20 MG tablet  Commonly known as:  REVATIO  Take 1 tablet (20 mg total) by mouth as directed. Take 1 - 5 tablets PO daily as needed        Allergies: No Known Allergies  Family History: Family History  Problem Relation Age of Onset  . Kidney disease Mother     Dialysis  . Cancer Mother     Breast  . Diabetes Father   . Cancer Father 84    Prostate    Social History:  reports that he quit smoking about 26 years ago. He does not have any smokeless tobacco history on file. He reports that he drinks alcohol. He reports that he does not use illicit drugs.  ROS: UROLOGY Frequent Urination?: No Hard to postpone urination?: No Burning/pain with urination?: No  Get up at night to urinate?: Yes Leakage of urine?: No Urine stream starts and stops?: No Trouble starting stream?: No Do you have to strain to urinate?: No Blood in urine?: No Urinary tract infection?: No Sexually transmitted disease?: No Injury to kidneys or bladder?: No Painful intercourse?: No Weak stream?: No Erection problems?: Yes Penile pain?: No  Gastrointestinal Nausea?: No Vomiting?: No Indigestion/heartburn?: No Diarrhea?: No Constipation?: No  Constitutional Fever: No Night sweats?: No Weight loss?: No Fatigue?: No  Skin Skin rash/lesions?: No Itching?: No  Eyes Blurred vision?: No Double vision?: No  Ears/Nose/Throat Sore throat?: No Sinus problems?: No  Hematologic/Lymphatic Swollen glands?: No Easy bruising?: No  Cardiovascular Leg  swelling?: No Chest pain?: No  Respiratory Cough?: No Shortness of breath?: No  Endocrine Excessive thirst?: No  Musculoskeletal Back pain?: Yes Joint pain?: No  Neurological Headaches?: No Dizziness?: No  Psychologic Depression?: No Anxiety?: No  Physical Exam: BP 133/80 mmHg  Pulse 69  Ht  (1.93 m)  Wt 234 lb 4.8 oz (106.278 kg)  BMI 28.53 kg/m2  Constitutional:  Alert and oriented, No acute distress. HEENT: [place] AT, moist mucus membranes.  Trachea midline, no masses. Cardiovascular: No clubbing, cyanosis, or edema. Respiratory: Normal respiratory effort, no increased work of breathing. GI: Abdomen is soft, nontender, nondistended, no abdominal masses GU: No CVA tenderness. Prostate is 1+ slight bogginess. Skin: No rashes, bruises or suspicious lesions. Lymph: No cervical or inguinal adenopathy. Neurologic: Grossly intact, no focal deficits, moving all 4 extremities. Psychiatric: Normal mood and affect.  Laboratory Data: Lab Results  Component Value Date   WBC 3.7 07/04/2015   HCT 43.3 07/04/2015   MCV 86 07/04/2015   PLT 218 07/04/2015    Lab Results  Component Value Date   CREATININE 1.22 07/04/2015    Lab Results  Component Value Date   PSA Normal 05/26/2013    No results found for: TESTOSTERONE  Lab Results  Component Value Date   HGBA1C 5.8* 07/04/2015    Urinalysis    Component Value Date/Time   GLUCOSEU Negative 06/11/2015 1013   BILIRUBINUR Negative 06/11/2015 1013   BILIRUBINUR NEGATIVE 06/06/2015 1221   PROTEINUR NEGATIVE 06/06/2015 1221   UROBILINOGEN 0.2 06/06/2015 1221   NITRITE Negative 06/11/2015 1013   NITRITE NEGATIVE 06/06/2015 1221   LEUKOCYTESUR Negative 06/11/2015 1013   LEUKOCYTESUR Negative 06/06/2015 1221     Assessment & Plan:     1 - Hematospermia - DRE  2 - Elevated PSA - It is unusual for prostate cancer to cause such a dramatic increase in PSA over such a short period as a month. When this occurs,  it is usually secondary to infection. His prostate is somewhat boggy on exam. We'll plan to treat him with Cipro 500 mg twice a day for 1 month and then recheck his PSA in approximately 8 weeks. He will follow-up at that time.  3. Erectile dysfunction - the patient was given a prescription for generic sildenafil 20 mg daily when necessary. As instructed to take 1-5 tablets as needed for intercourse. He is warned the risk of priapism. This was sent to the compounding pharmacy.   Return in about 8 weeks (around 09/13/2015) for with PSA one week prior.  Hildred Laser, MD  [facility] [address], [address] [place], [place] [address] [phone]

## 2015-07-20 LAB — URINALYSIS, COMPLETE
Bilirubin, UA: NEGATIVE
Glucose, UA: NEGATIVE
KETONES UA: NEGATIVE
LEUKOCYTES UA: NEGATIVE
Nitrite, UA: NEGATIVE
Protein, UA: NEGATIVE
RBC, UA: NEGATIVE
SPEC GRAV UA: 1.01 (ref 1.005–1.030)
Urobilinogen, Ur: 0.2 mg/dL (ref 0.2–1.0)
pH, UA: 6 (ref 5.0–7.5)

## 2015-07-20 LAB — MICROSCOPIC EXAMINATION
BACTERIA UA: NONE SEEN
EPITHELIAL CELLS (NON RENAL): NONE SEEN /HPF (ref 0–10)
RBC, UA: NONE SEEN /hpf (ref 0–?)

## 2015-08-27 ENCOUNTER — Other Ambulatory Visit: Payer: BC Managed Care – PPO

## 2015-08-27 DIAGNOSIS — R972 Elevated prostate specific antigen [PSA]: Secondary | ICD-10-CM

## 2015-08-28 LAB — PSA: PROSTATE SPECIFIC AG, SERUM: 1.7 ng/mL (ref 0.0–4.0)

## 2015-08-29 ENCOUNTER — Ambulatory Visit: Payer: BC Managed Care – PPO | Attending: Urology

## 2015-09-03 ENCOUNTER — Ambulatory Visit: Payer: BC Managed Care – PPO | Admitting: Urology

## 2015-09-03 ENCOUNTER — Telehealth: Payer: Self-pay

## 2015-09-03 NOTE — Telephone Encounter (Signed)
-----   Message from Hildred LaserBrian James Budzyn, MD sent at 08/31/2015  2:05 PM EDT ----- Please let patient know his PSA is now back down to 1.7 which is normal. It probably rose due to a prostate infection. I do not think he is at risk for prostate cancer, since his PSA is now back in the normal range.

## 2015-09-03 NOTE — Telephone Encounter (Signed)
Spoke with pt in reference to PSA results and risk of prostate cancer. Pt voiced understanding. Pt f/u appt cancelled due to results given via telephone.

## 2015-10-16 ENCOUNTER — Emergency Department
Admission: EM | Admit: 2015-10-16 | Discharge: 2015-10-16 | Disposition: A | Discharge status: Home / Self Care | Payer: BC Managed Care – PPO | Attending: Emergency Medicine | Admitting: Emergency Medicine

## 2015-10-16 ENCOUNTER — Ambulatory Visit (INDEPENDENT_AMBULATORY_CARE_PROVIDER_SITE_OTHER): Payer: BC Managed Care – PPO | Admitting: Family Medicine

## 2015-10-16 ENCOUNTER — Encounter: Payer: Self-pay | Admitting: Family Medicine

## 2015-10-16 VITALS — BP 138/84 | HR 95 | Temp 100.4°F | Resp 16 | Wt 227.8 lb

## 2015-10-16 DIAGNOSIS — Z79899 Other long term (current) drug therapy: Secondary | ICD-10-CM | POA: Diagnosis not present

## 2015-10-16 DIAGNOSIS — Z8673 Personal history of transient ischemic attack (TIA), and cerebral infarction without residual deficits: Secondary | ICD-10-CM | POA: Insufficient documentation

## 2015-10-16 DIAGNOSIS — Z7982 Long term (current) use of aspirin: Secondary | ICD-10-CM | POA: Insufficient documentation

## 2015-10-16 DIAGNOSIS — Z86718 Personal history of other venous thrombosis and embolism: Secondary | ICD-10-CM | POA: Insufficient documentation

## 2015-10-16 DIAGNOSIS — K130 Diseases of lips: Secondary | ICD-10-CM

## 2015-10-16 DIAGNOSIS — N182 Chronic kidney disease, stage 2 (mild): Secondary | ICD-10-CM | POA: Insufficient documentation

## 2015-10-16 DIAGNOSIS — L0291 Cutaneous abscess, unspecified: Secondary | ICD-10-CM

## 2015-10-16 MED ORDER — OXYCODONE-ACETAMINOPHEN 5-325 MG PO TABS
2.0000 | ORAL_TABLET | Freq: Once | ORAL | Status: AC
  Administered 2015-10-16: 2 via ORAL

## 2015-10-16 MED ORDER — MUPIROCIN 2 % EX OINT: TOPICAL_OINTMENT | CUTANEOUS | Status: DC

## 2015-10-16 MED ORDER — LIDOCAINE HCL (PF) 1 % IJ SOLN
INTRAMUSCULAR | Status: AC
  Filled 2015-10-16: qty 5

## 2015-10-16 MED ORDER — LIDOCAINE-EPINEPHRINE (PF) 1 %-1:200000 IJ SOLN
30.0000 mL | Freq: Once | INTRAMUSCULAR | Status: AC
  Administered 2015-10-16: 30 mL

## 2015-10-16 MED ORDER — OXYCODONE-ACETAMINOPHEN 5-325 MG PO TABS: 2.0000 | ORAL_TABLET | Freq: Four times a day (QID) | ORAL | Status: DC | PRN

## 2015-10-16 MED ORDER — LIDOCAINE-EPINEPHRINE (PF) 1 %-1:200000 IJ SOLN
INTRAMUSCULAR | Status: AC
  Administered 2015-10-16: 30 mL
  Filled 2015-10-16: qty 30

## 2015-10-16 MED ORDER — OXYCODONE-ACETAMINOPHEN 5-325 MG PO TABS
ORAL_TABLET | ORAL | Status: AC
  Administered 2015-10-16: 2 via ORAL
  Filled 2015-10-16: qty 2

## 2015-10-16 MED ORDER — LIDOCAINE-EPINEPHRINE 1 %-1:100000 IJ SOLN: 10.0000 mL | Freq: Once | INTRAMUSCULAR | Status: DC

## 2015-10-16 MED ORDER — LIDOCAINE HCL (PF) 1 % IJ SOLN: 30.0000 mL | Freq: Once | INTRAMUSCULAR | Status: DC

## 2015-10-16 NOTE — ED Notes (Signed)
Pt states sent by PCP for abscess to the lower lip.. States he was placed on abx Sunday and the swelling is worse today, states his temp at PCP was 100..Marland Kitchen

## 2015-10-16 NOTE — Patient Instructions (Signed)
Please do go from here to the ER

## 2015-10-16 NOTE — Progress Notes (Signed)
BP 138/84 mmHg  Pulse 95  Temp(Src) 100.4 F (38 C) (Oral)  Resp 16  Wt 227 lb 12.8 oz (103.329 kg)  SpO2 96%   Subjective:    Patient ID: Nathaniel [name], male    DOB: May 11, 1956, 60 y.o.   MRN: [id number]  HPI: Nathaniel [name] is a 60 y.o. male  Chief Complaint  Patient presents with  . Lip Laceration    onset 4 days ago went to urgent care and was given antibiotic and steriod.  Here today following up, states it is worse, swollen and painful.  Patient also has a fever.    Patient is here for urgent care follow-up; he went to urgent care over the weekend after developing a sore along his lower lip right side; it got larger and he was treated there with antibiotics and steroids; since the weekend, however, the lip has swollen up even larger; it is not actively draining; he has a low-grade fever; nothing similar reported; not on ACE-I  Depression screen [facility] 2/9 10/16/2015 06/06/2015  Decreased Interest 0 0  Down, Depressed, Hopeless 0 0  PHQ - 2 Score 0 0   Relevant past medical, surgical, family and social history reviewed Past Medical History  Diagnosis Date  . History of elevated PSA     Has seen Urologist in [place]: Prostate Biopsy Benign (Specialist wanted to do biopsies q6 months but patient did not think this was necessary and would rather monitor with annual PSA's)  . History of DVT (deep vein thrombosis)     DVT right lower extremity in 2003 on coumadin for 3 months, INR levels subtherapeutic, started having slurred words and memory issues, extensive TIA/CVA work up done, no residual deficits. Switched to asa plus plavix ever since and has done well thus far.  . Pre-diabetes   . History of TIA (transient ischemic attack)   . CKD (chronic kidney disease) stage 2, GFR 60-89 ml/min    Social History  Substance Use Topics  . Smoking status: Never Smoker   . Smokeless tobacco: None  . Alcohol Use: 0.0 oz/week    0 Standard drinks or equivalent per week   Interim medical  history since last visit reviewed. Allergies and medications reviewed  Review of Systems Per HPI unless specifically indicated above     Objective:    BP 138/84 mmHg  Pulse 95  Temp(Src) 100.4 F (38 C) (Oral)  Resp 16  Wt 227 lb 12.8 oz (103.329 kg)  SpO2 96%  Wt Readings from Last 3 Encounters:  10/16/15 227 lb (102.967 kg)  10/16/15 227 lb 12.8 oz (103.329 kg)  07/19/15 234 lb 4.8 oz (106.278 kg)    Physical Exam  Constitutional: He appears well-developed and well-nourished. No distress.  HENT:  Nose: No rhinorrhea.  Mouth/Throat: Mucous membranes are normal.  The lower lip, right side primarily, is swollen and tense, stretched almost to the point of tearing; there is a focus along the lower right side vermillion border which appears to be amenable to I&D  Lymphadenopathy:       Head (right side): Submandibular (shoddy) adenopathy present.  Skin: Skin is warm. He is not diaphoretic. No pallor.    Results for orders placed or performed in visit on 08/27/15  PSA  Result Value Ref Range   Prostate Specific Ag, Serum 1.7 0.0 - 4.0 ng/mL      Assessment & Plan:   Problem List Items Addressed This Visit      Digestive  Abscess of lip - Primary    Patient has been on TMP/SMX DS for four days, and the lip is actually worse now per his report; there is a focus along the lower vermillion border which appears to be amenable to I&D; also running low-grade elevation of temperature; will refer to ED for treatment         Follow up plan: No Follow-up on file.  An after-visit summary was printed and given to the patient at check-out.  Please see the patient instructions which may contain other information and recommendations beyond what is mentioned above in the assessment and plan.  Meds ordered this encounter  Medications  . methylPREDNISolone (MEDROL DOSEPAK) 4 MG TBPK tablet    Sig:   . DISCONTD: sulfamethoxazole-trimethoprim (BACTRIM DS,SEPTRA DS) 800-160 MG tablet      Sig:

## 2015-10-16 NOTE — ED Provider Notes (Signed)
[facility] Emergency Department Provider Note        Time seen: ----------------------------------------- 5:05 PM on 10/16/2015 -----------------------------------------    I have reviewed the triage vital signs and the nursing notes.   HISTORY  Chief Complaint Abscess    HPI Nathaniel [name] is a 60 y.o. male who presents ER with an abscess to his lower lip. Patient states he is placed on antibiotics on Sunday and the swelling has gotten worse. His temperature is noted to be slightly elevated today. He denies any chills or other complaints. Patient does not have history of abscesses.   Past Medical History  Diagnosis Date  . History of elevated PSA     [place] Prostate Biopsy Benign (Specialist wanted to do biopsies q6 months but patient did not think this was necessary and would rather monitor with annual PSA's)  . History of DVT (deep vein thrombosis)     DVT right lower extremity in 2003 on coumadin for 3 months, INR levels subtherapeutic, started having slurred words and memory issues, extensive TIA/CVA work up done, no residual deficits. Switched to asa plus plavix ever since and has done well thus far.  . CKD (chronic kidney disease) stage 2, GFR 60-89 ml/min   . CKD (chronic kidney disease) stage 2, GFR 60-89 ml/min   . Pre-diabetes   . History of TIA (transient ischemic attack)     Patient Active Problem List   Diagnosis Date Noted  . Annual physical exam 07/06/2015  . Elevated PSA, less than 10 ng/ml 07/06/2015  . Encounter for screening for malignant neoplasm of colon 07/06/2015  . Need for Tdap vaccination 07/06/2015  . Lumbar back pain 07/06/2015  . Hematospermia 06/11/2015  . CKD (chronic kidney disease) stage 2, GFR 60-89 ml/min 06/06/2015  . Prediabetes 06/06/2015  . History of TIA (transient ischemic attack) 06/06/2015  . History of elevated PSA   . History of DVT (deep vein thrombosis)     Past Surgical History   Procedure Laterality Date  . No past surgeries    . Biopsy prostate  ?2012    neg    Allergies Review of patient's allergies indicates no known allergies.  Social History Social History  Substance Use Topics  . Smoking status: Never Smoker   . Smokeless tobacco: Not on file  . Alcohol Use: 0.0 oz/week    0 Standard drinks or equivalent per week    Review of Systems Constitutional: Positive for fever ENT: Negative for sore throat. Positive for lip swelling Skin: Positive for right sided lower lip swelling and abscess formation  ____________________________________________   PHYSICAL EXAM:  VITAL SIGNS: ED Triage Vitals  Enc Vitals Group     BP 10/16/15 1637 152/94 mmHg     Pulse Rate 10/16/15 1637 88     Resp 10/16/15 1637 20     Temp 10/16/15 1637 98.6 F (37 C)     Temp Source 10/16/15 1637 Oral     SpO2 10/16/15 1637 98 %     Weight 10/16/15 1637 227 lb (102.967 kg)     Height 10/16/15 1637 6\' 4"  (1.93 m)     Head Cir --      Peak Flow --      Pain Score 10/16/15 1701 4     Pain Loc --      Pain Edu? --      Excl. in GC? --     Constitutional: Alert and oriented. Well appearing and  in no distress. Eyes: Conjunctivae are normal. PERRL. Normal extraocular movements. ENT   Head: Normocephalic and atraumatic.   Nose: No congestion/rhinnorhea.   Mouth/Throat: Mucous membranes are moist, large right sided lower lip swelling with obvious fluctuance and pointing abscess, some erythema is noted as well. No intraoral involvement   Neck: No stridor. Neurologic:  Normal speech and language. Skin:  Large right sided lower lip abscess Psychiatric: Mood and affect are normal. Speech and behavior are normal.  ___________________________________________  ED COURSE:  Pertinent labs & imaging results that were available during my care of the patient were reviewed by me and considered in my medical decision making (see chart for details). Patient resents to  ER with a lip abscess that will need incision and drainage. He is currently taking Septra.  INCISION AND DRAINAGE Performed by: Daryel November E Consent: Verbal consent obtained. Risks and benefits: risks, benefits and alternatives were discussed Type: abscess  Body area: Lower lip  Anesthesia: local infiltration  Incision was made with a scalpel.  Local anesthetic: lidocaine 1 % with epinephrine  Anesthetic total: 2 ml  Complexity: complex Blunt dissection to break up loculations  Drainage: purulent  Drainage amount: Moderate   Packing material: 1/4 in iodoform gauze  Patient tolerance: Patient tolerated the procedure well with no immediate complications.    ____________________________________________  FINAL ASSESSMENT AND PLAN  Abscess  Plan: Patient with a lip abscess that has undergone successful incision and drainage. I advised him to remove the packing tomorrow. I will prescribe Bactroban for him to apply topically 3 times a day.   Emily Filbert, MD   Note: This dictation was prepared with Dragon dictation. Any transcriptional errors that result from this process are unintentional   Emily Filbert, MD 10/16/15 1751

## 2015-10-16 NOTE — Discharge Instructions (Signed)
Abscess °An abscess is an infected area that contains a collection of pus and debris. It can occur in almost any part of the body. An abscess is also known as a furuncle or boil. °CAUSES  °An abscess occurs when tissue gets infected. This can occur from blockage of oil or sweat glands, infection of hair follicles, or a minor injury to the skin. As the body tries to fight the infection, pus collects in the area and creates pressure under the skin. This pressure causes pain. People with weakened immune systems have difficulty fighting infections and get certain abscesses more often.  °SYMPTOMS °Usually an abscess develops on the skin and becomes a painful mass that is red, warm, and tender. If the abscess forms under the skin, you may feel a moveable soft area under the skin. Some abscesses break open (rupture) on their own, but most will continue to get worse without care. The infection can spread deeper into the body and eventually into the bloodstream, causing you to feel ill.  °DIAGNOSIS  °Your caregiver will take your medical history and perform a physical exam. A sample of fluid may also be taken from the abscess to determine what is causing your infection. °TREATMENT  °Your caregiver may prescribe antibiotic medicines to fight the infection. However, taking antibiotics alone usually does not cure an abscess. Your caregiver may need to make a small cut (incision) in the abscess to drain the pus. In some cases, gauze is packed into the abscess to reduce pain and to continue draining the area. °HOME CARE INSTRUCTIONS  °· Only take over-the-counter or prescription medicines for pain, discomfort, or fever as directed by your caregiver. °· If you were prescribed antibiotics, take them as directed. Finish them even if you start to feel better. °· If gauze is used, follow your caregiver's directions for changing the gauze. °· To avoid spreading the infection: °· Keep your draining abscess covered with a  bandage. °· Wash your hands well. °· Do not share personal care items, towels, or whirlpools with others. °· Avoid skin contact with others. °· Keep your skin and clothes clean around the abscess. °· Keep all follow-up appointments as directed by your caregiver. °SEEK MEDICAL CARE IF:  °· You have increased pain, swelling, redness, fluid drainage, or bleeding. °· You have muscle aches, chills, or a general ill feeling. °· You have a fever. °MAKE SURE YOU:  °· Understand these instructions. °· Will watch your condition. °· Will get help right away if you are not doing well or get worse. °  °This information is not intended to replace advice given to you by your health care provider. Make sure you discuss any questions you have with your health care provider. °  °Document Released: 02/19/2005 Document Revised: 11/11/2011 Document Reviewed: 07/25/2011 °Elsevier Interactive Patient Education ©2016 [employer] ° °Incision and Drainage °Incision and drainage is a procedure in which a sac-like structure (cystic structure) is opened and drained. The area to be drained usually contains material such as pus, fluid, or blood.  °LET YOUR CAREGIVER KNOW ABOUT:  °· Allergies to medicine. °· Medicines taken, including vitamins, herbs, eyedrops, over-the-counter medicines, and creams. °· Use of steroids (by mouth or creams). °· Previous problems with anesthetics or numbing medicines. °· History of bleeding problems or blood clots. °· Previous surgery. °· Other health problems, including diabetes and kidney problems. °· Possibility of pregnancy, if this applies. °RISKS AND COMPLICATIONS °· Pain. °· Bleeding. °· Scarring. °· Infection. °BEFORE THE PROCEDURE  °  You may need to have an ultrasound or other imaging tests to see how large or deep your cystic structure is. Blood tests may also be used to determine if you have an infection or how severe the infection is. You may need to have a tetanus shot. °PROCEDURE  °The affected area  is cleaned with a cleaning fluid. The cyst area will then be numbed with a medicine (local anesthetic). A small incision will be made in the cystic structure. A syringe or catheter may be used to drain the contents of the cystic structure, or the contents may be squeezed out. The area will then be flushed with a cleansing solution. After cleansing the area, it is often gently packed with a gauze or another wound dressing. Once it is packed, it will be covered with gauze and tape or some other type of wound dressing.  °AFTER THE PROCEDURE  °· Often, you will be allowed to go home right after the procedure. °· You may be given antibiotic medicine to prevent or heal an infection. °· If the area was packed with gauze or some other wound dressing, you will likely need to come back in 1 to 2 days to get it removed. °· The area should heal in about 14 days. °  °This information is not intended to replace advice given to you by your health care provider. Make sure you discuss any questions you have with your health care provider. °  °Document Released: 11/05/2000 Document Revised: 11/11/2011 Document Reviewed: 07/07/2011 °Elsevier Interactive Patient Education ©2016 Elsevier Inc. ° °

## 2015-10-19 ENCOUNTER — Telehealth: Payer: Self-pay | Admitting: Family Medicine

## 2015-10-19 MED ORDER — SULFAMETHOXAZOLE-TRIMETHOPRIM 800-160 MG PO TABS: 1.0000 | ORAL_TABLET | Freq: Two times a day (BID) | ORAL | Status: AC

## 2015-10-19 NOTE — Telephone Encounter (Signed)
Rx sent; I don't know how long he was originally prescribed or how many more pills he needed, so I sent 10 more pills; take probiotic or eat yogurt for the next few weeks to replace healthy germs in the gut to help lessen chance of diarrhea

## 2015-10-19 NOTE — Telephone Encounter (Signed)
PT SAID THAT HE ACCIDENTALLY THREW AWAY HIS ANTIOBOTIC. PATIENT HAS BEEN TAKING IT SINCE Sunday  AND HE HAD TWENTY TABLETS. HIS LAST ONE WAS LAST NIGHT AND THAT IS WHEN HER THREW THEM AWAY. HAS NOT HAD ONE TODAY. COULD HE GET ENOUGH TO BE ABLE TO FINISH OUT HIS RX. PHARM IS [facility] ON [address]

## 2015-10-21 ENCOUNTER — Encounter: Payer: Self-pay | Admitting: Family Medicine

## 2015-10-21 DIAGNOSIS — K130 Diseases of lips: Secondary | ICD-10-CM | POA: Insufficient documentation

## 2015-10-21 NOTE — Assessment & Plan Note (Signed)
Patient has been on TMP/SMX DS for four days, and the lip is actually worse now per his report; there is a focus along the lower vermillion border which appears to be amenable to I&D; also running low-grade elevation of temperature; will refer to ED for treatment

## 2015-11-12 ENCOUNTER — Ambulatory Visit (INDEPENDENT_AMBULATORY_CARE_PROVIDER_SITE_OTHER): Payer: BC Managed Care – PPO | Admitting: Family Medicine

## 2015-11-12 ENCOUNTER — Encounter: Payer: Self-pay | Admitting: Family Medicine

## 2015-11-12 VITALS — BP 112/68 | HR 98 | Temp 97.6°F | Resp 14 | Wt 221.0 lb

## 2015-11-12 DIAGNOSIS — M10072 Idiopathic gout, left ankle and foot: Secondary | ICD-10-CM | POA: Diagnosis not present

## 2015-11-12 LAB — BASIC METABOLIC PANEL
BUN: 15 mg/dL (ref 7–25)
CALCIUM: 9.5 mg/dL (ref 8.6–10.3)
CO2: 28 mmol/L (ref 20–31)
Chloride: 103 mmol/L (ref 98–110)
Creat: 1.33 mg/dL — ABNORMAL HIGH (ref 0.70–1.25)
GLUCOSE: 96 mg/dL (ref 65–99)
POTASSIUM: 4.5 mmol/L (ref 3.5–5.3)
SODIUM: 140 mmol/L (ref 135–146)

## 2015-11-12 LAB — URIC ACID: Uric Acid, Serum: 7.6 mg/dL (ref 4.0–8.0)

## 2015-11-12 MED ORDER — HYDROCODONE-ACETAMINOPHEN 5-325 MG PO TABS: 1.0000 | ORAL_TABLET | Freq: Four times a day (QID) | ORAL | Status: DC | PRN

## 2015-11-12 MED ORDER — COLCHICINE 0.6 MG PO TABS: ORAL_TABLET | ORAL | Status: DC

## 2015-11-12 NOTE — Addendum Note (Signed)
Addended by: Davene CostainGRAVES, Zenita Kister C on: 11/12/2015 10:27 AM   Modules accepted: Orders

## 2015-11-12 NOTE — Progress Notes (Signed)
BP 112/68 mmHg  Pulse 98  Temp(Src) 97.6 F (36.4 C) (Oral)  Resp 14  Wt 221 lb (100.245 kg)  SpO2 96%   Subjective:    Patient ID: Nathaniel [name], male    DOB: 1955-06-30, 60 y.o.   MRN: [id number]  HPI: Nathaniel [name] is a 60 y.o. male  Chief Complaint  Patient presents with  . Gout    onset 1 week, went to urgent care last monhday and was dx with gout in left big toe was given prednisone with no relief.  Pt states it is worse with swelling.   He had pain in the left 1st MTP on Sunday night; went to urgent care on Tuesday; gave medrol dosepak did not really help; they did not give him anything for pain Steroids no problems No upset stomach or GI bleeding He has heard of colchicine Sister has gout too He has been eating shrimp; no [place] Added some tart cherry juice No fevers No trauma This is he joint that gets affected  He had an inflammation of the prostate; PSA down to 1.7, prostatitis  [facility] 11/12/2015 10/16/2015 06/06/2015  Decreased Interest 0 0 0  Down, Depressed, Hopeless 0 0 0  PHQ - 2 Score 0 0 0    No flowsheet data found.  Relevant past medical, surgical, family and social history reviewed Past Medical History  Diagnosis Date  . History of elevated PSA     [place] Prostate Biopsy Benign (Specialist wanted to do biopsies q6 months but patient did not think this was necessary and would rather monitor with annual PSA's)  . History of DVT (deep vein thrombosis)     DVT right lower extremity in 2003 on coumadin for 3 months, INR levels subtherapeutic, started having slurred words and memory issues, extensive TIA/CVA work up done, no residual deficits. Switched to asa plus plavix ever since and has done well thus far.  . Pre-diabetes   . History of TIA (transient ischemic attack)   . CKD (chronic kidney disease) stage 2, GFR 60-89 ml/min   . Gout    Past Surgical History  Procedure Laterality Date  . Biopsy prostate   ?2012    neg   Family History  Problem Relation Age of Onset  . Kidney disease Mother     Dialysis  . Cancer Mother     Breast  . Diabetes Father   . Cancer Father [id number]    Prostate   Social History  Substance Use Topics  . Smoking status: Never Smoker   . Smokeless tobacco: None  . Alcohol Use: 0.0 oz/week    0 Standard drinks or equivalent per week    Interim medical history since last visit reviewed. Allergies and medications reviewed  Review of Systems Per HPI unless specifically indicated above     Objective:    BP 112/68 mmHg  Pulse 98  Temp(Src) 97.6 F (36.4 C) (Oral)  Resp 14  Wt 221 lb (100.245 kg)  SpO2 96%  Wt Readings from Last 3 Encounters:  11/12/15 221 lb (100.245 kg)  10/16/15 227 lb (102.967 kg)  10/16/15 227 lb 12.8 oz (103.329 kg)    Physical Exam  Constitutional: He appears well-developed and well-nourished. No distress.  Cardiovascular: Normal rate.   Pulses:      Dorsalis pedis pulses are 2+ on the right side, and 2+ on the left side.  Pulmonary/Chest: Effort normal.  Musculoskeletal:  Left foot: There is decreased range of motion, tenderness and swelling.  Significant swelling of the 1st MTP LEFT foot with edema; erythema medially; no sign of foreign body entry; limited ROM of 1st MTP LEFT foot  Psychiatric: He has a normal mood and affect.   Results for orders placed or performed in visit on 08/27/15  PSA  Result Value Ref Range   Prostate Specific Ag, Serum 1.7 0.0 - 4.0 ng/mL      Assessment & Plan:   Problem List Items Addressed This Visit    None    Visit Diagnoses    Acute idiopathic gout involving toe of left foot    -  Primary    Relevant Orders    Uric acid    Basic metabolic panel        Follow up plan: No Follow-up on file.  An after-visit summary was printed and given to the patient at check-out.  Please see the patient instructions which may contain other information and recommendations beyond what is  mentioned above in the assessment and plan.  Meds ordered this encounter  Medications  . colchicine 0.6 MG tablet    Sig: Take two pills by mouth at onset of flare, then one pill one hour later    Dispense:  3 tablet    Refill:  2  . HYDROcodone-acetaminophen (NORCO/VICODIN) 5-325 MG tablet    Sig: Take 1 tablet by mouth every 6 (six) hours as needed for moderate pain.    Dispense:  12 tablet    Refill:  0    Orders Placed This Encounter  Procedures  . Uric acid  . Basic metabolic panel

## 2015-11-12 NOTE — Patient Instructions (Addendum)
Limit alcohol Try to avoid foods rich in purines: organ meats, red meat, chicken soup, gravies, etc. Start the colchicine Stay well-hydrated Use the pain medicine if needed, but never combine with any thing else like alcohol or sleeping pill Call if needed  Gout Gout is an inflammatory arthritis caused by a buildup of uric acid crystals in the joints. Uric acid is a chemical that is normally present in the blood. When the level of uric acid in the blood is too high it can form crystals that deposit in your joints and tissues. This causes joint redness, soreness, and swelling (inflammation). Repeat attacks are common. Over time, uric acid crystals can form into masses (tophi) near a joint, destroying bone and causing disfigurement. Gout is treatable and often preventable. CAUSES  The disease begins with elevated levels of uric acid in the blood. Uric acid is produced by your body when it breaks down a naturally found substance called purines. Certain foods you eat, such as meats and fish, contain high amounts of purines. Causes of an elevated uric acid level include:  Being passed down from parent to child (heredity).  Diseases that cause increased uric acid production (such as obesity, psoriasis, and certain cancers).  Excessive alcohol use.  Diet, especially diets rich in meat and seafood.  Medicines, including certain cancer-fighting medicines (chemotherapy), water pills (diuretics), and aspirin.  Chronic kidney disease. The kidneys are no longer able to remove uric acid well.  Problems with metabolism. Conditions strongly associated with gout include:  Obesity.  High blood pressure.  High cholesterol.  Diabetes. Not everyone with elevated uric acid levels gets gout. It is not understood why some people get gout and others do not. Surgery, joint injury, and eating too much of certain foods are some of the factors that can lead to gout attacks. SYMPTOMS   An attack of gout comes  on quickly. It causes intense pain with redness, swelling, and warmth in a joint.  Fever can occur.  Often, only one joint is involved. Certain joints are more commonly involved:  Base of the big toe.  Knee.  Ankle.  Wrist.  Finger. Without treatment, an attack usually goes away in a few days to weeks. Between attacks, you usually will not have symptoms, which is different from many other forms of arthritis. DIAGNOSIS  Your caregiver will suspect gout based on your symptoms and exam. In some cases, tests may be recommended. The tests may include:  Blood tests.  Urine tests.  X-rays.  Joint fluid exam. This exam requires a needle to remove fluid from the joint (arthrocentesis). Using a microscope, gout is confirmed when uric acid crystals are seen in the joint fluid. TREATMENT  There are two phases to gout treatment: treating the sudden onset (acute) attack and preventing attacks (prophylaxis).  Treatment of an Acute Attack.  Medicines are used. These include anti-inflammatory medicines or steroid medicines.  An injection of steroid medicine into the affected joint is sometimes necessary.  The painful joint is rested. Movement can worsen the arthritis.  You may use warm or cold treatments on painful joints, depending which works best for you.  Treatment to Prevent Attacks.  If you suffer from frequent gout attacks, your caregiver may advise preventive medicine. These medicines are started after the acute attack subsides. These medicines either help your kidneys eliminate uric acid from your body or decrease your uric acid production. You may need to stay on these medicines for a very long time.  The early  phase of treatment with preventive medicine can be associated with an increase in acute gout attacks. For this reason, during the first few months of treatment, your caregiver may also advise you to take medicines usually used for acute gout treatment. Be sure you  understand your caregiver's directions. Your caregiver may make several adjustments to your medicine dose before these medicines are effective.  Discuss dietary treatment with your caregiver or dietitian. Alcohol and drinks high in sugar and fructose and foods such as meat, poultry, and seafood can increase uric acid levels. Your caregiver or dietitian can advise you on drinks and foods that should be limited. HOME CARE INSTRUCTIONS   Do not take aspirin to relieve pain. This raises uric acid levels.  Only take over-the-counter or prescription medicines for pain, discomfort, or fever as directed by your caregiver.  Rest the joint as much as possible. When in bed, keep sheets and blankets off painful areas.  Keep the affected joint raised (elevated).  Apply warm or cold treatments to painful joints. Use of warm or cold treatments depends on which works best for you.  Use crutches if the painful joint is in your leg.  Drink enough fluids to keep your urine clear or pale yellow. This helps your body get rid of uric acid. Limit alcohol, sugary drinks, and fructose drinks.  Follow your dietary instructions. Pay careful attention to the amount of protein you eat. Your daily diet should emphasize fruits, vegetables, whole grains, and fat-free or low-fat milk products. Discuss the use of coffee, vitamin C, and cherries with your caregiver or dietitian. These may be helpful in lowering uric acid levels.  Maintain a healthy body weight. SEEK MEDICAL CARE IF:   You develop diarrhea, vomiting, or any side effects from medicines.  You do not feel better in 24 hours, or you are getting worse. SEEK IMMEDIATE MEDICAL CARE IF:   Your joint becomes suddenly more tender, and you have chills or a fever. MAKE SURE YOU:   Understand these instructions.  Will watch your condition.  Will get help right away if you are not doing well or get worse.   This information is not intended to replace advice  given to you by your health care provider. Make sure you discuss any questions you have with your health care provider.   Document Released: 05/09/2000 Document Revised: 06/02/2014 Document Reviewed: 12/24/2011 Elsevier Interactive Patient Education [employer]2016 Elsevier Inc.

## 2015-11-20 ENCOUNTER — Other Ambulatory Visit: Payer: Self-pay | Admitting: Family Medicine

## 2015-11-20 DIAGNOSIS — E790 Hyperuricemia without signs of inflammatory arthritis and tophaceous disease: Secondary | ICD-10-CM | POA: Insufficient documentation

## 2015-11-20 DIAGNOSIS — N182 Chronic kidney disease, stage 2 (mild): Secondary | ICD-10-CM

## 2015-11-20 NOTE — Assessment & Plan Note (Signed)
Bumped up; recheck in one month

## 2016-02-21 ENCOUNTER — Encounter: Payer: Self-pay | Admitting: *Deleted

## 2016-02-21 ENCOUNTER — Telehealth: Payer: Self-pay | Admitting: Family Medicine

## 2016-02-21 ENCOUNTER — Ambulatory Visit
Admission: RE | Admit: 2016-02-21 | Discharge: 2016-02-21 | Disposition: A | Discharge status: Home / Self Care | Payer: PRIVATE HEALTH INSURANCE | Source: Ambulatory Visit | Attending: Family Medicine | Admitting: Family Medicine

## 2016-02-21 ENCOUNTER — Ambulatory Visit: Payer: BC Managed Care – PPO | Admitting: Family Medicine

## 2016-02-21 ENCOUNTER — Emergency Department: Payer: PRIVATE HEALTH INSURANCE

## 2016-02-21 ENCOUNTER — Emergency Department
Admission: EM | Admit: 2016-02-21 | Discharge: 2016-02-21 | Disposition: A | Discharge status: Home / Self Care | Payer: PRIVATE HEALTH INSURANCE | Attending: Emergency Medicine | Admitting: Emergency Medicine

## 2016-02-21 ENCOUNTER — Encounter: Payer: Self-pay | Admitting: Family Medicine

## 2016-02-21 ENCOUNTER — Other Ambulatory Visit
Admission: RE | Admit: 2016-02-21 | Discharge: 2016-02-21 | Disposition: A | Discharge status: Home / Self Care | Payer: PRIVATE HEALTH INSURANCE | Source: Ambulatory Visit | Attending: *Deleted | Admitting: *Deleted

## 2016-02-21 ENCOUNTER — Ambulatory Visit
Admission: RE | Admit: 2016-02-21 | Discharge: 2016-02-21 | Disposition: A | Discharge status: Home / Self Care | Payer: PRIVATE HEALTH INSURANCE | Source: Ambulatory Visit | Attending: *Deleted | Admitting: *Deleted

## 2016-02-21 ENCOUNTER — Ambulatory Visit (INDEPENDENT_AMBULATORY_CARE_PROVIDER_SITE_OTHER): Payer: No Typology Code available for payment source | Admitting: Family Medicine

## 2016-02-21 DIAGNOSIS — M545 Low back pain, unspecified: Secondary | ICD-10-CM

## 2016-02-21 DIAGNOSIS — I82401 Acute embolism and thrombosis of unspecified deep veins of right lower extremity: Secondary | ICD-10-CM | POA: Diagnosis not present

## 2016-02-21 DIAGNOSIS — Z86718 Personal history of other venous thrombosis and embolism: Secondary | ICD-10-CM | POA: Diagnosis not present

## 2016-02-21 DIAGNOSIS — M2578 Osteophyte, vertebrae: Secondary | ICD-10-CM

## 2016-02-21 DIAGNOSIS — M7989 Other specified soft tissue disorders: Secondary | ICD-10-CM | POA: Diagnosis present

## 2016-02-21 DIAGNOSIS — M25471 Effusion, right ankle: Secondary | ICD-10-CM

## 2016-02-21 DIAGNOSIS — Z79899 Other long term (current) drug therapy: Secondary | ICD-10-CM | POA: Insufficient documentation

## 2016-02-21 DIAGNOSIS — M47816 Spondylosis without myelopathy or radiculopathy, lumbar region: Secondary | ICD-10-CM

## 2016-02-21 DIAGNOSIS — Z7982 Long term (current) use of aspirin: Secondary | ICD-10-CM | POA: Diagnosis not present

## 2016-02-21 DIAGNOSIS — N182 Chronic kidney disease, stage 2 (mild): Secondary | ICD-10-CM | POA: Insufficient documentation

## 2016-02-21 DIAGNOSIS — M25473 Effusion, unspecified ankle: Secondary | ICD-10-CM | POA: Insufficient documentation

## 2016-02-21 DIAGNOSIS — I82441 Acute embolism and thrombosis of right tibial vein: Secondary | ICD-10-CM | POA: Insufficient documentation

## 2016-02-21 LAB — FIBRIN DERIVATIVES D-DIMER (ARMC ONLY): Fibrin derivatives D-dimer (ARMC): 1703 — ABNORMAL HIGH (ref 0–499)

## 2016-02-21 MED ORDER — CEPHALEXIN 500 MG PO CAPS: 500.0000 mg | ORAL_CAPSULE | Freq: Two times a day (BID) | ORAL | 0 refills | Status: AC

## 2016-02-21 MED ORDER — APIXABAN 5 MG PO TABS
10.0000 mg | ORAL_TABLET | Freq: Once | ORAL | Status: AC
  Administered 2016-02-21: 10 mg via ORAL
  Filled 2016-02-21: qty 2

## 2016-02-21 MED ORDER — APIXABAN 5 MG PO TABS
ORAL_TABLET | ORAL | 0 refills | Status: DC
Start: 2016-02-21 — End: 2016-03-04

## 2016-02-21 NOTE — Assessment & Plan Note (Addendum)
Possible DVT though unable to elicit a Homan's sign; explained risk will always be higher for him to have another blood clot in this leg; start antibiotics in case it is possible cellulitis; stat D-dimer to evaluate for DVT; reasons to go to urgent care or call reviewed (worsening of symptoms, extension of redness up the leg, fever, etc)

## 2016-02-21 NOTE — Telephone Encounter (Signed)
We call lab just around 5 pm to get results of D-dimer; it was not available yet, so I gave staff my cell number I received a call at 5:15 pm that D-dimer was over 1000 I called patient at 5:18 pm, left detailed message to go to the ER now, very likely has a blood clot, could be extensive, need to have scanned and started on blood thinners now I called patient again at 5:28 pm, left another detailed message to go to the ER I came to the hospital to see if was sitting outside the lab or xray; staff says he has been to xray, but is not here any more; staff at registration tried alternate contact number in [place], but we got a fast busy and all circuits busy message I went down to cafeteria, did not see him I called his pharmacy; he picked up med at 5:29 pm Another # they had: [phone] "Person is unavailable" I tried Kalman ShanMarvetta Rowen, changing area code to 210, left msg asking if they had a loved one in [place], [place] who saw doctor today, that patient needs to go to ER now (I did not leave name of pt or identifying info for privacy reasons) I tried home number one more time and reached him; explained above, go to ER now; he agrees

## 2016-02-21 NOTE — Progress Notes (Signed)
BP 110/74 (BP Location: Left Arm, Patient Position: Sitting, Cuff Size: Large)   Pulse 78   Temp 97.9 F (36.6 C) (Oral)   Resp 18   Ht 6\' 4"  (1.93 m)   Wt 228 lb 5 oz (103.6 kg)   SpO2 97%   BMI 27.79 kg/m    Subjective:    Patient ID: Nathaniel [name], male    DOB: March 10, 1956, 60 y.o.   MRN: [id number]  HPI: Nathaniel [name] is a 60 y.o. male  Chief Complaint  Patient presents with  . Ankle Pain    Swelling   . Back Pain   He is having swelling in his right ankle He cut his right shin about 5 days on; moving furniture Then noticed swelling below it; it has been warm and slightly red; no pain or swelling more than usual in the calf He had a blood clot in the right lower extremity in 2003, was on coumadin but levels subtherapeutic, then TIA type symptoms, switched to ASA and plavix at that time; no return of clot since then to his knowledge Tetanus UTD  He had back pain; saw orthopaedist; has been doing exercises; he can hear a cracking sound; never has a sharp pain from that; he demonstrates leaning side to side Radiation pain down the right leg to outside lateral knee; gets better with stretches for 2 weeks; would like xrays; no loss of control of B/B  Depression screen [facility] 2/9 02/21/2016 11/12/2015 10/16/2015 06/06/2015  Decreased Interest 0 0 0 0  Down, Depressed, Hopeless 0 0 0 0  PHQ - 2 Score 0 0 0 0   Relevant past medical, surgical, family and social history reviewed Past Medical History:  Diagnosis Date  . CKD (chronic kidney disease) stage 2, GFR 60-89 ml/min   . Gout   . History of DVT (deep vein thrombosis)    DVT right lower extremity in 2003 on coumadin for 3 months, INR levels subtherapeutic, started having slurred words and memory issues, extensive TIA/CVA work up done, no residual deficits. Switched to asa plus plavix ever since and has done well thus far.  Marland Kitchen History of elevated PSA    Has seen Urologist in [place]: Prostate Biopsy Benign (Specialist wanted to do  biopsies q6 months but patient did not think this was necessary and would rather monitor with annual PSA's)  . History of TIA (transient ischemic attack)   . Pre-diabetes    Past Surgical History:  Procedure Laterality Date  . BIOPSY PROSTATE  ?2012   neg   Family History  Problem Relation Age of Onset  . Kidney disease Mother     Dialysis  . Cancer Mother     Breast  . Diabetes Father   . Cancer Father 70    Prostate   Social History  Substance Use Topics  . Smoking status: Never Smoker  . Smokeless tobacco: Not on file  . Alcohol use 0.0 oz/week    Interim medical history since last visit reviewed. Allergies and medications reviewed  Review of Systems Per HPI unless specifically indicated above     Objective:    BP 110/74 (BP Location: Left Arm, Patient Position: Sitting, Cuff Size: Large)   Pulse 78   Temp 97.9 F (36.6 C) (Oral)   Resp 18   Ht 6\' 4"  (1.93 m)   Wt 228 lb 5 oz (103.6 kg)   SpO2 97%   BMI 27.79 kg/m   Wt Readings from Last 3 Encounters:  02/21/16 228 lb 5 oz (103.6 kg)  11/12/15 221 lb (100.2 kg)  10/16/15 227 lb (103 kg)    Physical Exam  Constitutional: He appears well-developed and well-nourished. No distress.  Cardiovascular: Normal rate.   Pulmonary/Chest: Effort normal.  Musculoskeletal:       Lumbar back: He exhibits normal range of motion, no tenderness, no edema, no deformity and no spasm.       Right lower leg: He exhibits swelling, edema and laceration (right shin, irregular scratch consistent with injury several days old; no purulence or fluctuance).  Unable to elicit a Homan's sign on the right; the right leg from below the knee is larger than the left; no erythema above the laceration; mild discomfort with rotation above the waist with extension  Psychiatric: His mood appears not anxious. He does not exhibit a depressed mood.      Assessment & Plan:   Problem List Items Addressed This Visit      Other   Lumbar back pain     L-spine xrays and go from there; symptoms and exam are suggestive of facet arthropathy      Relevant Orders   DG Lumbar Spine Complete   History of DVT (deep vein thrombosis)    Hx of right LE DVT in 2003; check D-dimer and then get venous doppler if needed      Relevant Orders   D-Dimer, Quantitative   Ankle swelling    Possible DVT though unable to elicit a Homan's sign; explained risk will always be higher for him to have another blood clot in this leg; start antibiotics in case it is possible cellulitis; stat D-dimer to evaluate for DVT; reasons to go to urgent care or call reviewed (worsening of symptoms, extension of redness up the leg, fever, etc)      Relevant Orders   D-Dimer, Quantitative    Other Visit Diagnoses   None.     Follow up plan: No Follow-up on file.  An after-visit summary was printed and given to the patient at check-out.  Please see the patient instructions which may contain other information and recommendations beyond what is mentioned above in the assessment and plan.  Meds ordered this encounter  Medications  . cephALEXin (KEFLEX) 500 MG capsule    Sig: Take 1 capsule (500 mg total) by mouth 2 (two) times daily.    Dispense:  14 capsule    Refill:  0    Orders Placed This Encounter  Procedures  . DG Lumbar Spine Complete  . D-Dimer, Quantitative   Number to reach pt with results: [phone]

## 2016-02-21 NOTE — Assessment & Plan Note (Addendum)
L-spine xrays and go from there; symptoms and exam are suggestive of facet arthropathy

## 2016-02-21 NOTE — Discharge Instructions (Signed)
You were evaluated for right leg swelling, and your ultrasound was positive for a deep vein thrombosis.  As we discussed, discontinue and stop the Plavix. He may continue baby aspirin. You're being changed to a new blood thinner called Eloquis. The dose will be 10 mg twice a day for 7 days, and then 5 mg twice a day.  Return to the emergency room if you have any worsening symptoms including discomfort or pain, redness or skin rash, fever, or any worsening swelling, certainly any trouble breathing or shortness of breath.

## 2016-02-21 NOTE — ED Provider Notes (Signed)
[facility] Emergency Department Provider Note ____________________________________________   I have reviewed the triage vital signs and the triage nursing note.  HISTORY  Chief Complaint Leg Swelling   Historian Patient and spouse  HPI Nathaniel [name] is a 60 y.o. male with a history of DVT in the past for which at that time he was on Coumadin, however due to TIA was placed on aspirin and Plavix in the early 2000. Unknown cause of the DVT at that point in time.  He has been moving to a new house recently and so is been working a lot. About 5 or 6 days ago he sustained a slight injury to his right lower shin involving an abrasion/laceration that is currently healing. At that point time he noticed that his leg a little bit of swelling and has been a little concerned about the possibility for DVT. No real calf pain. No skin rash. No chest pain or trouble breathing.    Past Medical History:  Diagnosis Date  . CKD (chronic kidney disease) stage 2, GFR 60-89 ml/min   . Gout   . History of DVT (deep vein thrombosis)    DVT right lower extremity in 2003 on coumadin for 3 months, INR levels subtherapeutic, started having slurred words and memory issues, extensive TIA/CVA work up done, no residual deficits. Switched to asa plus plavix ever since and has done well thus far.  Marland Kitchen. History of elevated PSA    Has seen Urologist in [place]: Prostate Biopsy Benign (Specialist wanted to do biopsies q6 months but patient did not think this was necessary and would rather monitor with annual PSA's)  . History of TIA (transient ischemic attack)   . Pre-diabetes     Patient Active Problem List   Diagnosis Date Noted  . Ankle swelling 02/21/2016  . Elevated blood uric acid level 11/20/2015  . Abscess of lip 10/21/2015  . Annual physical exam 07/06/2015  . Elevated PSA, less than 10 ng/ml 07/06/2015  . Encounter for screening for malignant neoplasm of colon 07/06/2015  . Need for Tdap  vaccination 07/06/2015  . Lumbar back pain 07/06/2015  . Hematospermia 06/11/2015  . CKD (chronic kidney disease) stage 2, GFR 60-89 ml/min 06/06/2015  . Prediabetes 06/06/2015  . History of TIA (transient ischemic attack) 06/06/2015  . History of elevated PSA   . History of DVT (deep vein thrombosis)     Past Surgical History:  Procedure Laterality Date  . BIOPSY PROSTATE  ?2012   neg    Prior to Admission medications   Medication Sig Start Date End Date Taking? Authorizing Provider  aspirin 81 MG chewable tablet Chew by mouth.    Historical Provider, MD  cephALEXin (KEFLEX) 500 MG capsule Take 1 capsule (500 mg total) by mouth 2 (two) times daily. 02/21/16 02/28/16  Kerman PasseyMelinda P Lada, MD  clopidogrel (PLAVIX) 75 MG tablet Take 1 tablet (75 mg total) by mouth daily. 07/10/15   Edwena FeltyAshany Sundaram, MD  colchicine 0.6 MG tablet Take two pills by mouth at onset of flare, then one pill one hour later 11/12/15   Kerman PasseyMelinda P Lada, MD  HYDROcodone-acetaminophen (NORCO/VICODIN) 5-325 MG tablet Take 1 tablet by mouth every 6 (six) hours as needed for moderate pain. 11/12/15   Kerman PasseyMelinda P Lada, MD  sildenafil (REVATIO) 20 MG tablet Take 1 tablet (20 mg total) by mouth as directed. Take 1 - 5 tablets PO daily as needed 07/19/15   Hildred LaserBrian James Budzyn, MD    No Known Allergies  Family History  Problem Relation Age of Onset  . Kidney disease Mother     Dialysis  . Cancer Mother     Breast  . Diabetes Father   . Cancer Father 26    Prostate    Social History Social History  Substance Use Topics  . Smoking status: Never Smoker  . Smokeless tobacco: Never Used  . Alcohol use 0.0 oz/week    Review of Systems  Constitutional: Negative for Recent illnesses Eyes:  ENT: . Cardiovascular: Negative for chest pain. Respiratory: Negative for shortness of breath. Gastrointestinal: Negative for abdominal pain. Genitourinary:  Musculoskeletal: Negative for leg pain. Skin: Negative for rash. Neurological:  Negative for headache. 10 point Review of Systems otherwise negative ____________________________________________   PHYSICAL EXAM:  VITAL SIGNS: ED Triage Vitals  Enc Vitals Group     BP 02/21/16 1825 128/67     Pulse Rate 02/21/16 1825 88     Resp 02/21/16 1825 18     Temp 02/21/16 1825 98.3 F (36.8 C)     Temp Source 02/21/16 1825 Oral     SpO2 02/21/16 1825 98 %     Weight 02/21/16 1826 228 lb (103.4 kg)     Height 02/21/16 1826 6\' 4"  (1.93 m)     Head Circumference --      Peak Flow --      Pain Score 02/21/16 1833 1     Pain Loc --      Pain Edu? --      Excl. in GC? --      Constitutional: Alert and oriented. Well appearing and in no distress. HEENT   Head: Normocephalic and atraumatic.      Eyes: Conjunctivae are normal. PERRL. Normal extraocular movements.      Ears:         Nose: No congestion/rhinnorhea.   Mouth/Throat: Mucous membranes are moist.   Neck: No stridor. Cardiovascular/Chest: Normal rate, regular rhythm.  No murmurs, rubs, or gallops. Respiratory: Normal respiratory effort without tachypnea nor retractions. Breath sounds are clear and equal bilaterally. No wheezes/rales/rhonchi. Gastrointestinal: Soft. No distention, no guarding, no rebound. Nontender.    Genitourinary/rectal:Deferred Musculoskeletal: Trace edema right lower extremity. Healing abrasion on the lower right shin. No skin rash. No calf tenderness. Neurologic:  Normal speech and language. No gross or focal neurologic deficits are appreciated. Skin:  Skin is warm, dry and intact. No rash noted. Psychiatric: Mood and affect are normal. Speech and behavior are normal. Patient exhibits appropriate insight and judgment.   ____________________________________________  LABS (pertinent positives/negatives)  Labs Reviewed - No data to display  ____________________________________________    EKG I, Governor Rooks, MD, the attending physician have personally viewed and  interpreted all ECGs.  None ____________________________________________  RADIOLOGY All Xrays were viewed by me. Imaging interpreted by Radiologist.  Right lower extremity ultrasound:  IMPRESSION: Occlusive thrombus noted within one of the posterior tibial veins. There also appears to be thrombus within additional veins at the popliteal fossa and calf, possibly reflecting gastrocnemius veins.  These results were called by telephone at the time of interpretation on 02/21/2016 at 8:24 pm to Dr. Shaune Pollack, who verbally acknowledged these results. __________________________________________  PROCEDURES  Procedure(s) performed: None  Critical Care performed: None  ____________________________________________   ED COURSE / ASSESSMENT AND PLAN  Pertinent labs & imaging results that were available during my care of the patient were reviewed by me and considered in my medical decision making (see chart for details).    Nathaniel [name]  came for evaluation after noting some right lower extremity edema and a history of DVT and outpatient elevated d-dimer.  Ultrasound result positive for distal dvt in calf veins.  I spoke with hematologist due to prior dvt and on aspirin and plavix for prior tia to discuss anticoagulation plan.   CONSULTATIONS:   Hematology, phone consultation Dr. Merlene Pulling, recommends eliquis, dc plavix, follow up hematology and primary care.   Patient / Family / Caregiver informed of clinical course, medical decision-making process, and agree with plan.   I discussed return precautions, follow-up instructions, and discharge instructions with patient and/or family.   ___________________________________________   FINAL CLINICAL IMPRESSION(S) / ED DIAGNOSES   Final diagnoses:  DVT (deep venous thrombosis), right              Note: This dictation was prepared with Dragon dictation. Any transcriptional errors that result from this process are  unintentional    Governor Rooks, MD 02/21/16 2104

## 2016-02-21 NOTE — ED Triage Notes (Signed)
Pt arrived to ED reporting right ankle swelling. Pt reports hx of blood clot in right leg and was told to come to ED after having D-dimer checked today and MD reporting value was elevated. No SOB reported. Color appropriate bilaterally. Right ankle tender upon palpation. Pedal pulses strong and equal bilaterally. Pt able to ambulate independently and without difficulty.

## 2016-02-21 NOTE — Assessment & Plan Note (Signed)
Hx of right LE DVT in 2003; check D-dimer and then get venous doppler if needed

## 2016-02-21 NOTE — ED Notes (Signed)
Pharmacy called to send up Eliquis

## 2016-02-21 NOTE — Patient Instructions (Signed)
Please do eat yogurt daily or take a probiotic daily for the next month or two We want to replace the healthy germs in the gut If you notice foul, watery diarrhea in the next two months, schedule an appointment RIGHT AWAY Please go from here to the hospital lab for a stat D-dimer They should call me with the results

## 2016-02-22 ENCOUNTER — Telehealth: Payer: Self-pay

## 2016-02-22 ENCOUNTER — Encounter: Payer: Self-pay | Admitting: Hematology and Oncology

## 2016-02-22 ENCOUNTER — Encounter: Payer: Self-pay | Admitting: Family Medicine

## 2016-02-22 ENCOUNTER — Telehealth: Payer: Self-pay | Admitting: *Deleted

## 2016-02-22 NOTE — Telephone Encounter (Signed)
Please do whatever you can to get patient a savings card or see if hematologist or cardiology colleague has a card he can go pick up

## 2016-02-22 NOTE — Telephone Encounter (Signed)
Contact the rep or local pharmacy or go on-line to get savings card; one of our cardiology colleague offices may have coupon card as well

## 2016-02-22 NOTE — Telephone Encounter (Signed)
Patient called states ER changed him to eliquis and it is to exspensive we also do not have a coupon card?  Pt wants to know what to do?

## 2016-02-22 NOTE — Telephone Encounter (Signed)
Pt is a new pt for us on 10/17 and when Nehemiah SettleBrooke gave him a call about his appt. The pt states that he can't afford his med-eliquis.  He looked on intranet and it says you can have copay card for 1 month free.  He would like help.  I do not have copay cards but I did look on line and found coupon for 75% off cost and I printed it out and emailed pt after speaking to him first on the phone.  I gave him my number to call me back if he does not get coupon.

## 2016-02-25 NOTE — Telephone Encounter (Signed)
Bjorn LoserRhonda took care of and got him a coupon card

## 2016-02-26 ENCOUNTER — Other Ambulatory Visit: Payer: Self-pay | Admitting: Family Medicine

## 2016-02-26 DIAGNOSIS — M48061 Spinal stenosis, lumbar region without neurogenic claudication: Secondary | ICD-10-CM | POA: Insufficient documentation

## 2016-02-26 DIAGNOSIS — M5136 Other intervertebral disc degeneration, lumbar region: Secondary | ICD-10-CM

## 2016-02-26 DIAGNOSIS — M51369 Other intervertebral disc degeneration, lumbar region without mention of lumbar back pain or lower extremity pain: Secondary | ICD-10-CM | POA: Insufficient documentation

## 2016-02-26 NOTE — Progress Notes (Signed)
Refer to PT

## 2016-02-26 NOTE — Assessment & Plan Note (Signed)
Refer to PT

## 2016-03-03 ENCOUNTER — Encounter: Payer: Self-pay | Admitting: Family Medicine

## 2016-03-04 ENCOUNTER — Telehealth: Payer: Self-pay | Admitting: Family Medicine

## 2016-03-04 MED ORDER — APIXABAN 5 MG PO TABS: 5.0000 mg | ORAL_TABLET | Freq: Two times a day (BID) | ORAL | 2 refills | Status: DC

## 2016-03-04 NOTE — Telephone Encounter (Signed)
patient states when this med was first prescribed it was to expensive so he only bought half the rx.  He then received a coupon card for free month supply, but needs new rx for month supply since he used half rx the first time.

## 2016-03-04 NOTE — Telephone Encounter (Signed)
Pt would like a call back about Eliquis. Please return his call.

## 2016-03-04 NOTE — Telephone Encounter (Signed)
Pt returned your call.  

## 2016-03-10 ENCOUNTER — Ambulatory Visit (INDEPENDENT_AMBULATORY_CARE_PROVIDER_SITE_OTHER): Payer: No Typology Code available for payment source

## 2016-03-10 ENCOUNTER — Telehealth: Payer: Self-pay

## 2016-03-10 DIAGNOSIS — Z23 Encounter for immunization: Secondary | ICD-10-CM | POA: Diagnosis not present

## 2016-03-10 NOTE — Telephone Encounter (Signed)
Called pt to notify  pt that his forms for medication assistance has been sign and ready to be picked up.  Kipp LaurenceAmber Canio Winokur, CMA

## 2016-03-11 ENCOUNTER — Inpatient Hospital Stay: Payer: PRIVATE HEALTH INSURANCE

## 2016-03-11 ENCOUNTER — Inpatient Hospital Stay: Payer: PRIVATE HEALTH INSURANCE | Attending: Hematology and Oncology | Admitting: Hematology and Oncology

## 2016-03-11 ENCOUNTER — Encounter: Payer: Self-pay | Admitting: *Deleted

## 2016-03-11 DIAGNOSIS — Z79899 Other long term (current) drug therapy: Secondary | ICD-10-CM

## 2016-03-11 DIAGNOSIS — Z7901 Long term (current) use of anticoagulants: Secondary | ICD-10-CM | POA: Insufficient documentation

## 2016-03-11 DIAGNOSIS — Z8673 Personal history of transient ischemic attack (TIA), and cerebral infarction without residual deficits: Secondary | ICD-10-CM | POA: Insufficient documentation

## 2016-03-11 DIAGNOSIS — Z803 Family history of malignant neoplasm of breast: Secondary | ICD-10-CM

## 2016-03-11 DIAGNOSIS — Z8042 Family history of malignant neoplasm of prostate: Secondary | ICD-10-CM

## 2016-03-11 DIAGNOSIS — Z86718 Personal history of other venous thrombosis and embolism: Secondary | ICD-10-CM

## 2016-03-11 DIAGNOSIS — R7303 Prediabetes: Secondary | ICD-10-CM | POA: Diagnosis not present

## 2016-03-11 DIAGNOSIS — N182 Chronic kidney disease, stage 2 (mild): Secondary | ICD-10-CM | POA: Diagnosis not present

## 2016-03-11 DIAGNOSIS — I82441 Acute embolism and thrombosis of right tibial vein: Secondary | ICD-10-CM

## 2016-03-11 LAB — COMPREHENSIVE METABOLIC PANEL
ALT: 23 U/L (ref 17–63)
AST: 24 U/L (ref 15–41)
Albumin: 4.2 g/dL (ref 3.5–5.0)
Alkaline Phosphatase: 64 U/L (ref 38–126)
Anion gap: 7 (ref 5–15)
BUN: 16 mg/dL (ref 6–20)
CO2: 24 mmol/L (ref 22–32)
Calcium: 9.4 mg/dL (ref 8.9–10.3)
Chloride: 105 mmol/L (ref 101–111)
Creatinine, Ser: 1.18 mg/dL (ref 0.61–1.24)
GFR calc Af Amer: >60 mL/min (ref >60)
GFR calc non Af Amer: >60 mL/min (ref >60)
Glucose, Bld: 105 mg/dL — ABNORMAL HIGH (ref 65–99)
Potassium: 4.1 mmol/L (ref 3.5–5.1)
Sodium: 136 mmol/L (ref 135–145)
Total Bilirubin: 1 mg/dL (ref 0.3–1.2)
Total Protein: 7.6 g/dL (ref 6.5–8.1)

## 2016-03-11 LAB — CBC WITH DIFFERENTIAL/PLATELET
Basophils Absolute: 0 10*3/uL (ref 0–0.1)
Basophils Relative: 1 %
Eosinophils Absolute: 0.1 10*3/uL (ref 0–0.7)
Eosinophils Relative: 3 %
HCT: 43.1 % (ref 40.0–52.0)
Hemoglobin: 14.7 g/dL (ref 13.0–18.0)
Lymphocytes Relative: 32 %
Lymphs Abs: 1.1 10*3/uL (ref 1.0–3.6)
MCH: 28.8 pg (ref 26.0–34.0)
MCHC: 34.1 g/dL (ref 32.0–36.0)
MCV: 84.3 fL (ref 80.0–100.0)
Monocytes Absolute: 0.3 10*3/uL (ref 0.2–1.0)
Monocytes Relative: 8 %
Neutro Abs: 1.9 10*3/uL (ref 1.4–6.5)
Neutrophils Relative %: 56 %
Platelets: 215 10*3/uL (ref 150–440)
RBC: 5.11 MIL/uL (ref 4.40–5.90)
RDW: 13.2 % (ref 11.5–14.5)
WBC: 3.3 10*3/uL — ABNORMAL LOW (ref 3.8–10.6)

## 2016-03-11 NOTE — Progress Notes (Signed)
[facility]-  Cancer Center  Clinic day:  03/11/2016  Chief Complaint: Milus GlazierRicky Puls is a 60 y.o. male with a DVT who is referred by Dr. Sherie DonLada for assessment and management.  HPI: The patient presented with a DVT in his right calf approximately 13 years ago. He describes feeling like he had a charley horse in his leg.  He denied any precipitating events. He had been extremely active. [place] He was placed on Lovenox and converted to Coumadin.  He states that he had of a lot of tests done. He is unaware of any of the results, but feels that his workup was negative.  He is unclear how long he was on Coumadin.  He then describes an episode of disorientation years ago. He was hospitalized and diagnosed with a TIA.  He was on Coumadin at that time. More testing was done including an echocardiogram and carotid ultrasounds.  Coumadin was discontinued. He was placed on aspirin and Plavix.  He subsequently underwent yearly ultrasounds and right lower extremity duplexes without any concerning findings.  He was seen by Dr. Sherie DonLada for right ankle swelling.  D-dimers were elevated at 1703.  He was referred to the emergency room.  Right lower extremity duplex on 02/21/2016 revealed occlusive thrombus within one of the posterior tibial veins. There was apparent thrombus within additional veins at the popliteal fossa possibly reflecting gastrocnemius veins.  He continued his aspirin.  Plavix was discontinued.  He was started on Eliquis 10 mg BID  X 7 days then switched to 5 mg BID.  Symptomatically, he has felt well.  He had a sustained an injury to his right lower shin (abrasion/laceration) while moving to a new house. He denied any chest pain or shortness of breath.  He has been active with moving, but does note sitting at his computer working for hours.  He notes a normal PSA of 1.7 on 08/27/2015.  His last colonoscopy was 11 years ago (late).  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  He saw a urologist.  He has no family history of thrombosis.  His father had prostate cancer.   Past Medical History:  Diagnosis Date  . CKD (chronic kidney disease) stage 2, GFR 60-89 ml/min   . Gout   . History of DVT (deep vein thrombosis)    DVT right lower extremity in 2003 on coumadin for 3 months, INR levels subtherapeutic, started having slurred words and memory issues, extensive TIA/CVA work up done, no residual deficits. Switched to asa plus plavix ever since and has done well thus far.  Marland Kitchen. History of elevated PSA    [place] Prostate Biopsy Benign (Specialist wanted to do biopsies q6 months but patient did not think this was necessary and would rather monitor with annual PSA's)  . History of TIA (transient ischemic attack)   . Pre-diabetes     Past Surgical History:  Procedure Laterality Date  . BIOPSY PROSTATE  ?2012   neg    Family History  Problem Relation Age of Onset  . Kidney disease Mother     Dialysis  . Cancer Mother     Breast  . Diabetes Father   . Cancer Father [id number]    Prostate    Social History:  reports that he has never smoked. He has never used smokeless tobacco. He reports that he drinks alcohol. He reports that he does not use drugs.  He  smoked for 4 months in 1991.  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  He has 2 daughters (age 93 and 48) who were both in the Olympics (track and volleyball).  The patient is alone today.  Allergies: No Known Allergies  Current Medications: Current Outpatient Prescriptions  Medication Sig Dispense Refill  . apixaban (ELIQUIS) 5 MG TABS tablet Take 1 tablet (5 mg total) by mouth 2 (two) times daily. 60 tablet 2  . aspirin 81 MG chewable tablet Chew by mouth.    . colchicine 0.6 MG tablet Take two pills by mouth at onset of flare, then one pill one hour later (Patient not taking: Reported on  03/11/2016) 3 tablet 2   No current facility-administered medications for this visit.     Review of Systems:  GENERAL:  Feels good.  Active.  No fevers, sweats or weight loss. PERFORMANCE STATUS (ECOG):  0 HEENT:  No visual changes, runny nose, sore throat, mouth sores or tenderness. Lungs: No shortness of breath or cough.  No hemoptysis. Cardiac:  No chest pain, palpitations, orthopnea, or PND. GI:  No nausea, vomiting, diarrhea, constipation, melena or hematochezia.  Last colonoscopy 11 years ago. GU:  No urgency, frequency, dysuria, or hematuria.  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 Musculoskeletal:  No back pain.  No joint pain.  No muscle tenderness. Extremities:  No pain or swelling. Skin:  No rashes or skin changes. Neuro:  No headache, numbness or weakness, balance or coordination issues. Endocrine:  No diabetes, thyroid issues, hot flashes or night sweats. Psych:  No mood changes, depression or anxiety. Pain:  No focal pain. Review of systems:  All other systems reviewed and found to be negative.  Physical Exam: Blood pressure 122/83, pulse 72, temperature (!) 95.9 F (35.5 C), temperature source Tympanic, resp. rate 18, height 6\' 4"  (1.93 m), weight 225 lb 12 oz (102.4 kg). GENERAL:  Well developed, well nourished, gentleman sitting comfortably in the exam room in no acute distress. MENTAL STATUS:  Alert and oriented to person, place and time. HEAD:  Alopecia.  Slight mustache.  Small left ear ring.  Normocephalic, atraumatic, face symmetric, no Cushingoid features. EYES:  Glasses.  Brown eyes.  Pupils equal round and reactive to light and accomodation.  No conjunctivitis or scleral icterus. ENT:  Oropharynx clear without lesion.  Tongue normal. Mucous membranes moist.  RESPIRATORY:  Clear to auscultation without rales, wheezes or rhonchi. CARDIOVASCULAR:  Regular rate and rhythm without murmur, rub or gallop. ABDOMEN:  Soft, non-tender, with active bowel sounds,  and no hepatosplenomegaly.  No masses. SKIN:  No rashes, ulcers or lesions. EXTREMITIES: No edema, no skin discoloration or tenderness.  No palpable cords. LYMPH NODES: No palpable cervical, supraclavicular, axillary or inguinal adenopathy  NEUROLOGICAL: Unremarkable. PSYCH:  Appropriate.  No visits with results within 3 Day(s) from this visit.  Latest known visit with results is:  Hospital Outpatient Visit on 02/21/2016  Component Date Value Ref Range Status  . Fibrin derivatives D-dimer ([facility]) 02/21/2016 1703* 0 - 499 Final   Comment: <> Exclusion of Venous Thromboembolism (VTE) - OUTPATIENTS ONLY        (Emergency Department or [place])             0-499 ng/ml (FEU)  : With a low to intermediate pretest  probability for VTE this test result                                        excludes the diagnosis of VTE.           > 499 ng/ml (FEU)  : VTE not excluded.  Additional work up                                   for VTE is required.   <>  Testing on Inpatients and Evaluation of Disseminated Intravascular        Coagulation (DIC)             Reference Range:   0-499 ng/ml (FEU)     Assessment:  Audra Bellard is a 60 y.o. 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  Initial DVT was in his right calf approximately 13 years ago while in [place], [place]. He denied any precipitating events. He is extremely active. He was placed on Lovenox and converted to Coumadin.  He is unclear how long he was on Coumadin.  He then had an episode of disorientation years ago. He was diagnosed with a TIA. Coumadin was discontinued. He was placed on aspirin and Plavix.    Right lower extremity duplex on 02/21/2016 revealed occlusive thrombus within one of the posterior tibial veins. There was apparent thrombus within additional veins at the popliteal fossa possibly reflecting gastrocnemius veins.  He is on aspirin and Eliquis.   PSA was 1.7 on 08/27/2015.  His  last colonoscopy was 11 years ago (late).  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  He has no family history of thrombosis.  His father had prostate cancer.  Symptomatically, he feels well.  Exam is unremarkable.  Plan: 1.  Review diagnosis and management of recurrent DVT.  Discuss no apparent precipitating events. Discuss life long anticoagulation.  Etiology of TIA years ago unclear.  Will rule out a lupus anticoagulant.  Discuss initial screening for Factor V Leiden and prothrombin gene mutation.  Ensure CBC is normal.  Anticipate additional testing (protein C, protein S, and ATIII) in future.  Discuss continuation of Eliquis.  Patient has coverage for initial month.  Discuss not running out of medication. 2.  Labs today:  CBC with diff, CMP, Factor V Leiden, prothrombin gene mutation, lupus anticoagulant panel, anticardiolipin antibodies, beta-2 glycoprotein. 3.  Continue Eliquis 5 mg BID. 4.  Discuss plan for need for colonoscopy (plan to schedule after 6 months of Eliquis before temporarily holding medication for procedure). 5.  RTC in 2 weeks for MD assessment and review of testing.   Rosey Bath, MD  03/11/2016, 9:26 AM

## 2016-03-11 NOTE — Progress Notes (Signed)
Patient here today as new evaluation regarding DVT in right calf.  Patient c/o of a couple of knots on his lower right leg that he noticed after cutting his leg while moving.  Would like MD to assess them.

## 2016-03-12 LAB — CARDIOLIPIN ANTIBODIES, IGG, IGM, IGA
Anticardiolipin IgA: <9 APL U/mL (ref 0–11)
Anticardiolipin IgG: <9 GPL U/mL (ref 0–14)
Anticardiolipin IgM: <9 MPL U/mL (ref 0–12)

## 2016-03-12 LAB — BETA-2-GLYCOPROTEIN I ABS, IGG/M/A
Beta-2 Glyco I IgG: <9 GPI IgG units (ref 0–20)
Beta-2-Glycoprotein I IgA: <9 GPI IgA units (ref 0–25)
Beta-2-Glycoprotein I IgM: <9 GPI IgM units (ref 0–32)

## 2016-03-13 ENCOUNTER — Encounter: Payer: Self-pay | Admitting: Hematology and Oncology

## 2016-03-13 LAB — LUPUS ANTICOAGULANT PANEL
DRVVT: 66.8 s — ABNORMAL HIGH (ref 0.0–47.0)
PTT Lupus Anticoagulant: 39.8 s (ref 0.0–51.9)

## 2016-03-13 LAB — DRVVT CONFIRM: dRVVT Confirm: 1.1 ratio (ref 0.8–1.2)

## 2016-03-13 LAB — DRVVT MIX: dRVVT Mix: 51 s — ABNORMAL HIGH (ref 0.0–47.0)

## 2016-03-15 ENCOUNTER — Other Ambulatory Visit: Payer: Self-pay | Admitting: Family Medicine

## 2016-03-15 NOTE — Telephone Encounter (Signed)
Please resolve early refill request with pharmacy for Eliquis; I approved a 3 month supply on 03/04/16 for the new instructions, should not need any more until January 2018

## 2016-03-17 LAB — PROTHROMBIN GENE MUTATION

## 2016-03-17 LAB — FACTOR 5 LEIDEN

## 2016-03-17 MED ORDER — APIXABAN 5 MG PO TABS: 5.0000 mg | ORAL_TABLET | Freq: Two times a day (BID) | ORAL | 0 refills | Status: DC

## 2016-03-17 NOTE — Telephone Encounter (Signed)
[name], Im leaving the office. Just in case you get a call back from ChelyanRicky.

## 2016-03-17 NOTE — Telephone Encounter (Signed)
I spoke with the pt and he stated he wasn't able to get the 90 day supply prescribed due to cost. The discount card that was given was only for a month supply and he was able to get only 35 pills with that card. He stated that he is  waiting on a response  from [place] assistance and or a response to a job offer so he can continue to get 90 day supply. He stated he has enough pills to last him until Friday; therefore if he can get a prescription for 35 pills instead of 90 day supply for right now,  That would  give him enough time to either get the assistance or the job offer so he can afford it.

## 2016-03-17 NOTE — Telephone Encounter (Signed)
Called pt regarding his RX refill request for Eliquis. No answer; left a voicemail to give Asher MuirJamie or I a called regarding this matter.

## 2016-03-17 NOTE — Telephone Encounter (Signed)
Patient is seeing hematologist for this; I would be very surprised if they did not have samples; please ask him to contact her office right away to help figure out how to get his medicine and perhaps get samples

## 2016-03-18 NOTE — Telephone Encounter (Signed)
Faxed Rx to [facility]

## 2016-03-25 ENCOUNTER — Inpatient Hospital Stay: Payer: PRIVATE HEALTH INSURANCE | Admitting: Hematology and Oncology

## 2016-03-26 ENCOUNTER — Ambulatory Visit: Payer: No Typology Code available for payment source | Attending: Family Medicine

## 2016-03-26 DIAGNOSIS — M545 Low back pain: Secondary | ICD-10-CM | POA: Insufficient documentation

## 2016-03-26 DIAGNOSIS — M5416 Radiculopathy, lumbar region: Secondary | ICD-10-CM | POA: Diagnosis present

## 2016-03-26 DIAGNOSIS — G8929 Other chronic pain: Secondary | ICD-10-CM | POA: Diagnosis present

## 2016-03-26 NOTE — Patient Instructions (Addendum)
   Pt was recommneded to perform seated trunk rotation bilaterally 10x3 and use a lumbar towel roll when sitting long periods. Pt verbalized understanding.

## 2016-03-26 NOTE — Therapy (Signed)
[facility] [address] [place], [address] Phone: [phone]   Fax:  [phone]  Physical Therapy Evaluation  Patient Details  Name: Nathaniel [name] MRN: [id number] Date of Birth: [birth year] Referring Provider: Baruch Gouty, MD  Encounter Date: 03/26/2016      PT End of Session - 03/26/16 0818    Visit Number 1   Number of Visits 9   Date for PT Re-Evaluation 04/24/16   PT Start Time 0822   PT Stop Time 0908   PT Time Calculation (min) 46 min   Activity Tolerance Patient tolerated treatment well   Behavior During Therapy [facility] for tasks assessed/performed      Past Medical History:  Diagnosis Date  . CKD (chronic kidney disease) stage 2, GFR 60-89 ml/min   . Gout   . History of DVT (deep vein thrombosis)    DVT right lower extremity in 2003 on coumadin for 3 months, INR levels subtherapeutic, started having slurred words and memory issues, extensive TIA/CVA work up done, no residual deficits. Switched to asa plus plavix ever since and has done well thus far.  Marland Kitchen History of elevated PSA    Has seen Urologist in [place]: Prostate Biopsy Benign (Specialist wanted to do biopsies q6 months but patient did not think this was necessary and would rather monitor with annual PSA's)  . History of TIA (transient ischemic attack)   . Pre-diabetes     Past Surgical History:  Procedure Laterality Date  . BIOPSY PROSTATE  ?2012   neg    There were no vitals filed for this visit.       Subjective Assessment - 03/26/16 0826    Subjective Back pain: 3/10 current (pt sitting), 8/10 at worst (when getting out of bed, or when he has to drive more than an hour and a half)    Pertinent History Low Back Pain. Pt states that he was in the [employer]. Did not notice back pain until the last 5-6 years. Symptoms include stiffness. 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 Denies bowel or bladder problems or saddle anesthesia. Feels radiating symptoms R lateral LE. Does 5 stretches and walks which help him feel better. Movement helps. Stretches include supine posterior pelvic tilts, bridges, cat/camel, standing forward trunk flexion, side bending.  Walking 2 miles helps.  Has a DVT diagnosis 02/21/2016. Was told that his clot would be dissolved in a month after taking Elequis.  Pt states not having pain or cramps this time around but got a cut on his leg while moving.  Pt states that his oncologist measured his clotting factors whch came back negative.  Pt denies personal hx of CA.    Patient Stated Goals I'd Like to strengthen it (the muscles) to have less pain   Currently in Pain? Yes   Pain Score 3    Pain Location Back   Pain Orientation Lower   Pain Descriptors / Indicators Aching  deep ache   Pain Type Chronic pain   Pain Radiating Towards R lateral LE (around L5/S1 dermatome)   Pain Onset More than a month ago   Pain Frequency Occasional   Aggravating Factors  Getting out of bed, driving more than 1.5 hours, moving after being in a static position, long periods of sitting and lying down.   Pain Relieving Factors movement, stretching  [facility] PT Assessment - 03/26/16 [id number]      Assessment   Medical Diagnosis Foraminal stenosis or lumbar region, DDD lumbar    Referring Provider Baruch GoutyMelinda Lada, MD   Onset Date/Surgical Date 02/26/16  Date PT referral signed. Chronic condition   Next MD Visit March 31, 2016 for oncologist who monitors Elequis. Pt states that his MD told him to not increase activity but go back to his normal daily activities.    Prior Therapy Pt performing exercises such as bridges, supine posterior pelvic tilts, standing trunk flexion, standing trunk side bending which helps.      Precautions   Precaution Comments R leg DVT, currently taking Elequis and being monitored by his MD.  Pt states that his MD  told him not to increase activity but to go back to his normal daily activities.  Pt states that one of his daily activities involve basketball (just shoots the ball around for now). Pt also states that he has not tried running and is waiting for his next MD appointment before he tries to do so.      Restrictions   Other Position/Activity Restrictions Same as precautions     Balance Screen   Has the patient fallen in the past 6 months No   Has the patient had a decrease in activity level because of a fear of falling?  No   Is the patient reluctant to leave their home because of a fear of falling?  No     Home Environment   Additional Comments Patient lives in a 2 story home with his wife, 4 steps to enter, no rails, and 2 sets of 12 steps inside with R rail.       Prior Function   Vocation Full time employment  Firefighternline educator   Vocation Requirements PLOF: better able to get out of bed, drive greater than 1.5 hours, get up from prolonged sitting or lying down with less back pain   Leisure walk, basketball     Observation/Other Assessments   Observations Supine position: R LE slightly longer. Long sit position: R LE longer.    Modified Oswertry 12%     Posture/Postural Control   Posture Comments decreased bilateral hip extension, slight R pelvic rotation in standing.     AROM   Overall AROM Comments S/L R hip extension AAROM 2 degrees, L hip extension AAROM 3 degrees   Lumbar Flexion Full   Lumbar Extension WFL with central low back pressure   Lumbar - Right Side Bend WFL with bilateral PSIS pressure   Lumbar - Left Side Bend WFL with bilateral PSIS pressure   Lumbar - Right Rotation WFL with L PSIS discomfort   Lumbar - Left Rotation [facility]     Strength   Right Hip ABduction 5/5   Left Hip ABduction 5/5     Palpation   Palpation comment Slight anterior nutation R pelvis. Decreased P to A mobility to lumbar and thoracic spine.      Ambulation/Gait   Gait Comments R lateral  shifting during R LE stance phase                           PT Education - 03/26/16 1525    Education provided Yes   Education Details HEP, plan of care   Person(s) Educated Patient   Methods Explanation;Demonstration;Verbal cues   Comprehension Verbalized understanding;Returned demonstration  PT Long Term Goals - 03/26/16 1232      PT LONG TERM GOAL #1   Title Patient will have decraease in low back pain to 3/10 or less at worst to promote ability to perform functional tasks as well as improve ability to drive more than an hour and a half.    Baseline 8/10 at worst   Time 4   Period Weeks   Status New     PT LONG TERM GOAL #2   Title Patient will improve his Modified Oswestry Low Back Pain Disability score by at least 8% as a demonstration of improved function.    Baseline 12%   Time 4   Period Weeks   Status New     PT LONG TERM GOAL #3   Title Patient will report less difficulty with getting up from a prolonged sitting or lying position to promote mobility.    Baseline Difficulty getting up after sitting or lying for long periods due to back pain   Time 4   Period Weeks   Status New               Plan - 03/26/16 1104    Clinical Impression Statement Patient is a 60 year old male who came to physical therapy secondary to low back pain with R LE radiating symptoms. He also presents with altered gait pattern and posture, decreased lumbar and thoracic mobility, R LE symptoms along the L5/S1 dermatome, and difficulty with initial movement after being in a static position. Patient will benefit from skilled physical therapy services to address the aforementioned deficits.    Rehab Potential Good   Clinical Impairments Affecting Rehab Potential R LE DVT (currently taking Elequis and is being monitored by his MD)   PT Frequency 2x / week   PT Duration 4 weeks   PT Treatment/Interventions Manual techniques;Neuromuscular  re-education;Patient/family education;Therapeutic exercise;Therapeutic activities;Traction;Electrical Stimulation;Aquatic Therapy;Dry needling   PT Next Visit Plan lumbar and thoracic joint mobility exercises, manual therapy   Consulted and Agree with Plan of Care Patient      Patient will benefit from skilled therapeutic intervention in order to improve the following deficits and impairments:  Pain, Improper body mechanics, Decreased mobility  Visit Diagnosis: Chronic bilateral low back pain, with sciatica presence unspecified - Plan: PT plan of care cert/re-cert  Radiculopathy, lumbar region - Plan: PT plan of care cert/re-cert     Problem List Patient Active Problem List   Diagnosis Date Noted  . Foraminal stenosis of lumbar region 02/26/2016  . Degenerative disc disease, lumbar 02/26/2016  . Ankle swelling 02/21/2016  . 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 Elevated blood uric acid level 11/20/2015  . Annual physical exam 07/06/2015  . Elevated PSA, less than 10 ng/ml 07/06/2015  . Encounter for screening for malignant neoplasm of colon 07/06/2015  . Need for Tdap vaccination 07/06/2015  . Lumbar back pain 07/06/2015  . Hematospermia 06/11/2015  . CKD (chronic kidney disease) stage 2, GFR 60-89 ml/min 06/06/2015  . Prediabetes 06/06/2015  . History of TIA (transient ischemic attack) 06/06/2015  . History of elevated PSA   . History of DVT (deep vein thrombosis)    Loralyn FreshwaterMiguel Lawrence Roldan PT, DPT   03/26/2016, 3:35 PM  [facility] [address] [place], [place], [id number] Phone: [phone]   Fax:  [phone]  Name: Nathaniel [name] MRN: [id number] Date of Birth: 1956/05/17

## 2016-03-31 ENCOUNTER — Inpatient Hospital Stay
Payer: No Typology Code available for payment source | Attending: Hematology and Oncology | Admitting: Hematology and Oncology

## 2016-03-31 ENCOUNTER — Ambulatory Visit: Payer: PRIVATE HEALTH INSURANCE | Admitting: Hematology and Oncology

## 2016-03-31 ENCOUNTER — Encounter: Payer: Self-pay | Admitting: Hematology and Oncology

## 2016-03-31 ENCOUNTER — Ambulatory Visit: Payer: No Typology Code available for payment source

## 2016-03-31 VITALS — BP 122/77 | HR 62 | Temp 97.3°F | Resp 18 | Wt 225.8 lb

## 2016-03-31 DIAGNOSIS — Z87891 Personal history of nicotine dependence: Secondary | ICD-10-CM | POA: Insufficient documentation

## 2016-03-31 DIAGNOSIS — N182 Chronic kidney disease, stage 2 (mild): Secondary | ICD-10-CM | POA: Insufficient documentation

## 2016-03-31 DIAGNOSIS — Z803 Family history of malignant neoplasm of breast: Secondary | ICD-10-CM | POA: Diagnosis not present

## 2016-03-31 DIAGNOSIS — Z7982 Long term (current) use of aspirin: Secondary | ICD-10-CM | POA: Diagnosis not present

## 2016-03-31 DIAGNOSIS — Z8673 Personal history of transient ischemic attack (TIA), and cerebral infarction without residual deficits: Secondary | ICD-10-CM

## 2016-03-31 DIAGNOSIS — Z8042 Family history of malignant neoplasm of prostate: Secondary | ICD-10-CM

## 2016-03-31 DIAGNOSIS — M5416 Radiculopathy, lumbar region: Secondary | ICD-10-CM

## 2016-03-31 DIAGNOSIS — M109 Gout, unspecified: Secondary | ICD-10-CM | POA: Diagnosis not present

## 2016-03-31 DIAGNOSIS — I82441 Acute embolism and thrombosis of right tibial vein: Secondary | ICD-10-CM | POA: Diagnosis not present

## 2016-03-31 DIAGNOSIS — R7303 Prediabetes: Secondary | ICD-10-CM

## 2016-03-31 DIAGNOSIS — Z86718 Personal history of other venous thrombosis and embolism: Secondary | ICD-10-CM

## 2016-03-31 DIAGNOSIS — Z7901 Long term (current) use of anticoagulants: Secondary | ICD-10-CM | POA: Diagnosis not present

## 2016-03-31 DIAGNOSIS — M545 Low back pain: Secondary | ICD-10-CM | POA: Diagnosis not present

## 2016-03-31 DIAGNOSIS — G8929 Other chronic pain: Secondary | ICD-10-CM

## 2016-03-31 NOTE — Progress Notes (Signed)
[facility] day:  03/31/2016  Chief Complaint: Nathaniel [name] is a 60 y.o. male with recurrent right lower extremity DVT who is seen for review of work-up   HPI: The patient was last seen in the hematology clinic on 03/11/2016.  At that time, he was seen for initial consultation.  We discussed life long anticoagulation because of recurrent thrombosis.  He was on Eliquis 5 mg BID.  He has 2 daughters.  We discussed a need for colonoscopy (never had one) after anticoagulation for 6 months then briefly off anticoagulation.  We discussed thrombosis work-up.  Labs on 03/11/2016 revealed a hematocrit of 43.1, hemoglobin 14.7, MCV 84.3, platelets 215,000, white count 3300 with an ANC of 1900. Differential was unremarkable.  Comprehensive metabolic panel was normal.  Factor V Leiden was negative.   Prothrombin gene mutation was negative.  Lupus anticoagulant panel was negative with results consistent with specific inhibitors to one or more common pathway factors (X, V, II or fibrinogen).  Anticardiolipin antibodies were negative.  Beta-2-glycoprotein antibodies were negative.  Symptomatically, he is doing well.  He voices no complaints.   Past Medical History:  Diagnosis Date  . CKD (chronic kidney disease) stage 2, GFR 60-89 ml/min   . Gout   . History of DVT (deep vein thrombosis)    DVT right lower extremity in 2003 on coumadin for 3 months, INR levels subtherapeutic, started having slurred words and memory issues, extensive TIA/CVA work up done, no residual deficits. Switched to asa plus plavix ever since and has done well thus far.  Marland Kitchen History of elevated PSA    Has seen Urologist in [place]: Prostate Biopsy Benign (Specialist wanted to do biopsies q6 months but patient did not think this was necessary and would rather monitor with annual PSA's)  . History of TIA (transient ischemic attack)   . Pre-diabetes     Past Surgical History:  Procedure Laterality  Date  . BIOPSY PROSTATE  ?2012   neg    Family History  Problem Relation Age of Onset  . Kidney disease Mother     Dialysis  . Cancer Mother     Breast  . Diabetes Father   . Cancer Father 4    Prostate    Social History:  reports that he has never smoked. He has never used smokeless tobacco. He reports that he drinks alcohol. He reports that he does not use drugs.  He smoked for 4 months in 1991.  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  He has 2 daughters (age 69 and 71) who were both in the Olympics (track and volleyball).  The patient is alone today.  Allergies: No Known Allergies  Current Medications: Current Outpatient Prescriptions  Medication Sig Dispense Refill  . apixaban (ELIQUIS) 5 MG TABS tablet Take 1 tablet (5 mg total) by mouth 2 (two) times daily. 35 tablet 0  . aspirin 81 MG chewable tablet Chew by mouth.     No current facility-administered medications for this visit.     Review of Systems:  GENERAL:  Feels good.  Active.  No fevers or sweats.  Weight stable. PERFORMANCE STATUS (ECOG):  0 HEENT:  No visual changes, runny nose, sore throat, mouth sores or tenderness. Lungs: No shortness of breath or cough.  No hemoptysis. Cardiac:  No chest pain, palpitations, orthopnea, or PND. GI:  No nausea, vomiting, diarrhea, constipation, melena or hematochezia.  Last colonoscopy 11 years ago. GU:  No urgency, frequency, dysuria, or hematuria.  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 Musculoskeletal:  No back pain.  No joint pain.  No muscle tenderness. Extremities:  No pain or swelling. Skin:  No rashes or skin changes. Neuro:  No headache, numbness or weakness, balance or coordination issues. Endocrine:  No diabetes, thyroid issues, hot flashes or night sweats. Psych:  No mood changes, depression or anxiety. Pain:  No focal pain. Review of systems:  All other systems reviewed and  found to be negative.  Physical Exam: Blood pressure 122/77, pulse 62, temperature 97.3 F (36.3 C), temperature source Tympanic, resp. rate 18, weight 225 lb 12 oz (102.4 kg). GENERAL:  Well developed, well nourished, gentleman sitting comfortably in the exam room in no acute distress. MENTAL STATUS:  Alert and oriented to person, place and time. HEAD:  Alopecia.  Slight mustache.  Small left ear ring.  Normocephalic, atraumatic, face symmetric, no Cushingoid features. EYES:  Glasses.  Brown eyes.  Pupils equal round and reactive to light and accomodation.  No conjunctivitis or scleral icterus. ENT:  Oropharynx clear without lesion.  Tongue normal. Mucous membranes moist.  RESPIRATORY:  Clear to auscultation without rales, wheezes or rhonchi. CARDIOVASCULAR:  Regular rate and rhythm without murmur, rub or gallop. ABDOMEN:  Soft, non-tender, with active bowel sounds, and no hepatosplenomegaly.  No masses. SKIN:  No rashes, ulcers or lesions. EXTREMITIES: No edema, no skin discoloration or tenderness.  No palpable cords. LYMPH NODES: No palpable cervical, supraclavicular, axillary or inguinal adenopathy  NEUROLOGICAL: Unremarkable. PSYCH:  Appropriate.   No visits with results within 3 Day(s) from this visit.  Latest known visit with results is:  Clinical Support on 03/11/2016  Component Date Value Ref Range Status  . WBC 03/11/2016 3.3* 3.8 - 10.6 K/uL Final  . RBC 03/11/2016 5.11  4.40 - 5.90 MIL/uL Final  . Hemoglobin 03/11/2016 14.7  13.0 - 18.0 g/dL Final  . HCT 03/11/2016 43.1  40.0 - 52.0 % Final  . MCV 03/11/2016 84.3  80.0 - 100.0 fL Final  . MCH 03/11/2016 28.8  26.0 - 34.0 pg Final  . MCHC 03/11/2016 34.1  32.0 - 36.0 g/dL Final  . RDW 03/11/2016 13.2  11.5 - 14.5 % Final  . Platelets 03/11/2016 215  150 - 440 K/uL Final  . Neutrophils Relative % 03/11/2016 56  % Final  . Neutro Abs 03/11/2016 1.9  1.4 - 6.5 K/uL Final  . Lymphocytes Relative 03/11/2016 32  % Final  .  Lymphs Abs 03/11/2016 1.1  1.0 - 3.6 K/uL Final  . Monocytes Relative 03/11/2016 8  % Final  . Monocytes Absolute 03/11/2016 0.3  0.2 - 1.0 K/uL Final  . Eosinophils Relative 03/11/2016 3  % Final  . Eosinophils Absolute 03/11/2016 0.1  0 - 0.7 K/uL Final  . Basophils Relative 03/11/2016 1  % Final  . Basophils Absolute 03/11/2016 0.0  0 - 0.1 K/uL Final  . Sodium 03/11/2016 136  135 - 145 mmol/L Final  . Potassium 03/11/2016 4.1  3.5 - 5.1 mmol/L Final  . Chloride 03/11/2016 105  101 - 111 mmol/L Final  . CO2 03/11/2016 24  22 - 32 mmol/L Final  . Glucose, Bld 03/11/2016 105* 65 - 99 mg/dL Final  . BUN 03/11/2016 16  6 - 20 mg/dL Final  . Creatinine, Ser 03/11/2016 1.18  0.61 - 1.24 mg/dL Final  .  Calcium 03/11/2016 9.4  8.9 - 10.3 mg/dL Final  . Total Protein 03/11/2016 7.6  6.5 - 8.1 g/dL Final  . Albumin 03/11/2016 4.2  3.5 - 5.0 g/dL Final  . AST 03/11/2016 24  15 - 41 U/L Final  . ALT 03/11/2016 23  17 - 63 U/L Final  . Alkaline Phosphatase 03/11/2016 64  38 - 126 U/L Final  . Total Bilirubin 03/11/2016 1.0  0.3 - 1.2 mg/dL Final  . GFR calc non Af Amer 03/11/2016 >60  >60 mL/min Final  . GFR calc Af Amer 03/11/2016 >60  >60 mL/min Final   Comment: (NOTE) The eGFR has been calculated using the CKD EPI equation. This calculation has not been validated in all clinical situations. eGFR's persistently <60 mL/min signify possible Chronic Kidney Disease.   . Anion gap 03/11/2016 7  5 - 15 Final  . Anticardiolipin IgG 03/12/2016 <9  0 - 14 GPL U/mL Final   Comment: (NOTE)                          Negative:              <15                          Indeterminate:     15 - 20                          Low-Med Positive: >20 - 80                          High Positive:         >80   . Anticardiolipin IgM 03/12/2016 <9  0 - 12 MPL U/mL Final   Comment: (NOTE)                          Negative:              <13                          Indeterminate:     13 - 20                           Low-Med Positive: >20 - 80                          High Positive:         >80   . Anticardiolipin IgA 03/12/2016 <9  0 - 11 APL U/mL Final   Comment: (NOTE)                          Negative:              <12                          Indeterminate:     12 - 20                          Low-Med Positive: >20 - 80  High Positive:         >80 Performed At: [facility] [place], [place] [id number] Lindon Romp MD HB:[id number]   . Recommendations-F5LEID: 03/17/2016 Comment   Final   Comment: (NOTE) Result:  Negative (no mutation found) Factor V Leiden is a specific mutation (R506Q) in the factor V gene that is associated with an increased risk of venous thrombosis. Factor V Leiden is more resistant to inactivation by activated protein C.  As a result, factor V persists in the circulation leading to a mild hyper- coagulable state.  The Leiden mutation accounts for 90% - 95% of APC resistance.  Factor V Leiden has been reported in patients with deep vein thrombosis, pulmonary embolus, central retinal vein occlusion, cerebral sinus thrombosis and hepatic vein thrombosis. Other risk factors to be considered in the workup for venous thrombosis include the G20210A mutation in the factor II (prothrombin) gene, protein S and C deficiency, and antithrombin deficiencies. Anticardiolipin antibody and lupus anticoagulant analysis may be appropriate for certain patients, as well as homocysteine levels. Contact your local [facility] for information on how to order additi                          onal testing if desired.   . Comment 03/17/2016 Comment   Corrected   Comment: (NOTE) **Genetic counselors are available for health care providers to**  discuss results at [phone]). Methodology: DNA analysis of the Factor V gene was performed by allele-specific PCR. The diagnostic sensitivity and specificity is >99% for  both. Molecular-based testing is highly accurate, but as in any laboratory test, diagnostic errors may occur. All test results must be combined with clinical information for the most accurate interpretation. This test was developed and its performance characteristics determined by [facility]. It has not been cleared or approved by the Food and Drug Administration. References: Voelkerding K (1996).  Clin Lab Med [phone]. Allison Quarry, PhD, [facility] Ruben Reason, PhD, [facility] Jens Som, PhD, [facility] Annetta Maw, M.S., PhD, [facility] Alfredo Bach, PhD, [facility] Norva Riffle, PhD, [facility] Earlean Polka PhD, [facility] Performed At: [place], [place] [id number] Rema Fendt                          D IR:[id number]   . Recommendations-PTGENE: 03/17/2016 Comment   Final   Comment: (NOTE) NEGATIVE No mutation identified. Comment: A point mutation (G20210A) in the factor II (prothrombin) gene is the second most common cause of inherited thrombophilia. The incidence of this mutation in the [place] Caucasian population is about 2% and in the [place] American population it is approximately 0.5%. This mutation is rare in the [place] and Native American population. Being heterozygous for a prothrombin mutation increases the risk for developing venous thrombosis about 2 to 3 times above the general population risk. Being homozygous for the prothrombin gene mutation increases the relative risk for venous thrombosis further, although it is not yet known how much further the risk is increased. In women heterozygous for the prothrombin gene mutation, the use of estrogen containing oral contraceptives increases the relative risk of venous thrombosis about 16 times and the risk of developing cerebral thrombosis is also significantly increased. In pregnancy the pr                          othrombin gene  mutation increases risk for venous thrombosis and may  increase risk for stillbirth, placental abruption, pre-eclampsia and fetal growth restriction. If the patient possesses two or more congenital or acquired thrombophilic risk factors, the risk for thrombosis may rise to more than the sum of the risk ratios for the individual mutations. This assay detects only the prothrombin G20210A mutation and does not measure genetic abnormalities elsewhere in the genome. Other thrombotic risk factors may be pursued through systematic clinical laboratory analysis. These factors include the R506Q (Leiden) mutation in the Factor V gene, plasma homocysteine levels, as well as testing for deficiencies of antithrombin III, protein C and protein S.   . Additional Information 03/17/2016 Comment   Final   Comment: (NOTE) Genetic Counselors are available for health care providers to discuss results at [phone] [phone]). Methodology: DNA analysis of the Factor II gene was performed by PCR amplification followed by restriction analysis. The diagnostic sensitivity is >99% for both. All the tests must be combined with clinical information for the most accurate interpretation. Molecular-based testing is highly accurate, but as in any laboratory test, diagnostic errors may occur. This test was developed and its performance characteristics determined by [facility]. It has not been cleared or approved by the Food and Drug Administration. Poort SR, et al. Blood. 1996; 78:6767-2094. Varga EA. Circulation. 2004; 709:G28-Z66. Mervin Hack, et [place]; 19:700-703. Allison Quarry, PhD, [facility] Ruben Reason, PhD, [facility] Jens Som, PhD, [facility] Annetta Maw, M.S., PhD, [facility] Alfredo Bach, PhD, [facility] Norva Riffle, PhD, [facility] Earlean Polka,                           PhD, [facility] Performed At: [facility] [address] [place], [place] [id number] Nechama Guard MD KP:[id number]   . PTT Lupus Anticoagulant  03/13/2016 39.8  0.0 - 51.9 sec Final  . DRVVT 03/13/2016 66.8* 0.0 - 47.0 sec Final  . Lupus Anticoag Interp 03/13/2016 Comment:   Corrected   Comment: (NOTE) No lupus anticoagulant was detected. These results are consistent with specific inhibitors to one or more common pathway factors (X, V, II or fibrinogen). Performed At: [facility] [place], [place] [id number] Lindon Romp MD WH:[id number]   . Beta-2 Glyco I IgG 03/12/2016 <9  0 - 20 GPI IgG units Final   Comment: (NOTE) The reference interval reflects a 3SD or 99th percentile interval, which is thought to represent a potentially clinically significant result in accordance with the International Consensus Statement on the classification criteria for definitive antiphospholipid syndrome (APS). J Thromb Haem 2006;4:295-306.   . Beta-2-Glycoprotein I IgM 03/12/2016 <9  0 - 32 GPI IgM units Final   Comment: (NOTE) The reference interval reflects a 3SD or 99th percentile interval, which is thought to represent a potentially clinically significant result in accordance with the International Consensus Statement on the classification criteria for definitive antiphospholipid syndrome (APS). J Thromb Haem 2006;4:295-306. Performed At: [facility] [place], [place] [id number] Lindon Romp MD XB:[id number]   . Beta-2-Glycoprotein I IgA 03/12/2016 <9  0 - 25 GPI IgA units Final   Comment: (NOTE) The reference interval reflects a 3SD or 99th percentile interval, which is thought to represent a potentially clinically significant result in accordance with the International Consensus Statement on the classification criteria for definitive antiphospholipid syndrome (APS). J Thromb Haem 2006;4:295-306.   Marland Kitchen dRVVT Mix 03/13/2016 51.0* 0.0 - 47.0 sec  Final   Comment: (NOTE) Performed At: [facility] [place], [place] [id number] Lindon Romp MD QW:[id number]    . dRVVT Confirm 03/13/2016 1.1  0.8 - 1.2 ratio Final   Comment: (NOTE) Performed At: [facility] [place], [place] [id number] Lindon Romp MD YW:[id number]     Assessment:  Nathaniel [name] is a 60 y.o. 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  Initial DVT was in his right calf approximately 13 years ago while in [place], [place]. He denied any precipitating events. He is extremely active. He was placed on Lovenox and converted to Coumadin.  He is unclear how long he was on Coumadin.  He then had an episode of disorientation years ago. He was diagnosed with a TIA. Coumadin was discontinued. He was placed on aspirin and Plavix.    Right lower extremity duplex on 02/21/2016 revealed occlusive thrombus within one of the posterior tibial veins. There was apparent thrombus within additional veins at the popliteal fossa possibly reflecting gastrocnemius veins.  He is on aspirin and Eliquis.   Hypercoagulable work-up on 03/11/2016 revealed the following normal studies:  CBC with diff, factor V Leiden, prothrombin gene mutation , lupus anticoagulant panel, anticardiolipin antibodies, and beta-2-glycoprotein antibodies.  PSA was 1.7 on 08/27/2015.  His last colonoscopy was 11 years ago (late).  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  He has no family history of thrombosis.  His father had prostate cancer.  Symptomatically, he feels well.  Exam is unremarkable.  Plan: 1.  Review partial thrombophilia work-up.  No abnormalities detected.  Anticipate additional testing in future.  Discuss life long anticoagulation.   2.  Continue Eliquis.   3.  Review plan for need for colonoscopy (plan to schedule after 6 months of Eliquis before temporarily holding medication for procedure). 4.  RTC in 3 months for MD assessment and labs (CBC with diff, CMP, protein C antigen/activity, protein S antigen/activity, ATIII antigen/activity).   Lequita Asal, MD  03/31/2016, 3:04 PM

## 2016-03-31 NOTE — Therapy (Signed)
[facility] PHYSICAL AND SPORTS MEDICINE [address] [place], [place], [address] Phone: [phone]   Fax:  [phone]  Physical Therapy Treatment  Patient Details  Name: Nathaniel GlazierRicky [name] MRN: [id number] Date of Birth: [birth year] Referring Provider: Baruch GoutyMelinda Lada, MD  Encounter Date: 03/31/2016      PT End of Session - 03/31/16 0905    Visit Number 2   Number of Visits 9   Date for PT Re-Evaluation 04/24/16   PT Start Time 0905   PT Stop Time 0950   PT Time Calculation (min) 45 min   Activity Tolerance Patient tolerated treatment well   Behavior [facility] for tasks assessed/performed      Past Medical History:  Diagnosis Date  . CKD (chronic kidney disease) stage 2, GFR 60-89 ml/min   . Gout   . History of DVT (deep vein thrombosis)    DVT right lower extremity in 2003 on coumadin for 3 months, INR levels subtherapeutic, started having slurred words and memory issues, extensive TIA/CVA work up done, no residual deficits. Switched to asa plus plavix ever since and has done well thus far.  Marland Kitchen. History of elevated PSA    [place] Prostate Biopsy Benign (Specialist wanted to do biopsies q6 months but patient did not think this was necessary and would rather monitor with annual PSA's)  . History of TIA (transient ischemic attack)   . Pre-diabetes     Past Surgical History:  Procedure Laterality Date  . BIOPSY PROSTATE  ?2012   neg    There were no vitals filed for this visit.      Subjective Assessment - 03/31/16 0906    Subjective Back is stiff as he gets up. Loosens up when he moves around. Back pain is mostly on the L side in the morning. When he gets everything losened up, its mostly on the R side.  4/10 low back pain currently.  Pt also adds that his R calf muscle has always been bigger because he used that muscle more to compensate when his L leg was injured and in a cast back in 1977 (R calf has always been  big since then).    Pertinent History Low Back Pain. Pt states that he was in the [employer]. Did not notice back pain until the last 5-6 years. Symptoms include stiffness. 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 Denies bowel or bladder problems or saddle anesthesia. Feels radiating symptoms R lateral LE. Does 5 stretches and walks which help him feel better. Movement helps. Stretches include supine posterior pelvic tilts, bridges, cat/camel, standing forward trunk flexion, side bending.  Walking 2 miles helps.  Has a DVT diagnosis 02/21/2016. Was told that his clot would be dissolved in a month after taking Elequis.  Pt states not having pain or cramps this time around but got a cut on his leg while moving.  Pt states that his oncologist measured his clotting factors whch came back negative.  Pt denies personal hx of CA.    Patient Stated Goals I'd Like to strengthen it (the muscles) to have less pain   Currently in Pain? Yes   Pain Score 4    Pain Onset More than a month ago            [facility] Assessment - 03/31/16 0912      Observation/Other Assessments   Observations  Long sit test suggests anterior nutation L innominate, posterior nutation R innominate (long sit position: L LE shorter compared to R LE)         Objectives  There-ex   Directed patient with supine lower trunk rotation 10x2 with 5 second holds each side.    supine bilateral shoulder extension with thoracic towel roll 10x2 with 5 second holds  Decreased back pain to 3/10  Prone hip extension 10x2 with 5 second holds each LE  Then prone alternate arm and leg lift 10x5 second for 2 sets each side  Supine transversus abdominis and pelvic floor contraction 10x5 seconds   Supine hip fallouts 10x each LE.  Reviewed HEP. Pt demonstrated and verbalized understanding.      Improved exercise technique, movement at target joints, use of  target muscles after mod verbal, visual, tactile cues.    Difficulty with lumbopelvic control with supine hip fallouts L LE > R with ipsilateral pelvic rotation and contralateral LE movement compensation. Decreased low back pain with lumbar and thoracic mobility exercises. Decreased low back pain to 2/10 after session.                       PT Education - 03/31/16 0925    Education provided Yes   Education Details ther-ex, HEP   Person(s) Educated Patient   Methods Explanation;Demonstration;Tactile cues;Verbal cues;Handout   Comprehension Returned demonstration;Verbalized understanding             PT Long Term Goals - 03/26/16 1232      PT LONG TERM GOAL #1   Title Patient will have decraease in low back pain to 3/10 or less at worst to promote ability to perform functional tasks as well as improve ability to drive more than an hour and a half.    Baseline 8/10 at worst   Time 4   Period Weeks   Status New     PT LONG TERM GOAL #2   Title Patient will improve his Modified Oswestry Low Back Pain Disability score by at least 8% as a demonstration of improved function.    Baseline 12%   Time 4   Period Weeks   Status New     PT LONG TERM GOAL #3   Title Patient will report less difficulty with getting up from a prolonged sitting or lying position to promote mobility.    Baseline Difficulty getting up after sitting or lying for long periods due to back pain   Time 4   Period Weeks   Status New               Plan - 03/31/16 0926    Clinical Impression Statement Difficulty with lumbopelvic control with supine hip fallouts L LE > R with ipsilateral pelvic rotation and contralateral LE movement compensation. Decreased low back pain with lumbar and thoracic mobility exercises. Decreased low back pain to 2/10 after session.    Rehab Potential Good   Clinical Impairments Affecting Rehab Potential R LE DVT (currently taking Elequis and is being monitored by  his MD)   PT Frequency 2x / week   PT Duration 4 weeks   PT Treatment/Interventions Manual techniques;Neuromuscular re-education;Patient/family education;Therapeutic exercise;Therapeutic activities;Traction;Electrical Stimulation;Aquatic Therapy;Dry needling   PT Next Visit Plan lumbar and thoracic joint mobility exercises, manual therapy   Consulted and Agree with Plan of Care Patient      Patient will benefit from skilled therapeutic intervention in order to improve the following deficits and  impairments:  Pain, Improper body mechanics, Decreased mobility  Visit Diagnosis: Chronic bilateral low back pain, with sciatica presence unspecified  Radiculopathy, lumbar region     Problem List Patient Active Problem List   Diagnosis Date Noted  . Foraminal stenosis of lumbar region 02/26/2016  . Degenerative disc disease, lumbar 02/26/2016  . Ankle swelling 02/21/2016  . 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 Elevated blood uric acid level 11/20/2015  . Annual physical exam 07/06/2015  . Elevated PSA, less than 10 ng/ml 07/06/2015  . Encounter for screening for malignant neoplasm of colon 07/06/2015  . Need for Tdap vaccination 07/06/2015  . Lumbar back pain 07/06/2015  . Hematospermia 06/11/2015  . CKD (chronic kidney disease) stage 2, GFR 60-89 ml/min 06/06/2015  . Prediabetes 06/06/2015  . History of TIA (transient ischemic attack) 06/06/2015  . History of elevated PSA   . History of DVT (deep vein thrombosis)     Loralyn Freshwater PT, DPT   03/31/2016, 9:57 AM  [facility] PHYSICAL AND SPORTS MEDICINE [address] [place], [address] Phone: [phone]   Fax:  [phone]  Name: Nathaniel [name] MRN: [id number] Date of Birth: 1956-03-16

## 2016-03-31 NOTE — Progress Notes (Signed)
Patient here today for followup regarding DVT.  Taking Eliquis twice daily.  Patient offers no complaints today.

## 2016-03-31 NOTE — Patient Instructions (Addendum)
  Caudal Rotation: Hip Roll, Neutral Lordosis - Supine   Lie with knees bent, feet flat. Lower knees out to right side, rotating hips and trunk. Hold for 5 seconds. Return. Repeat for other side. Repeat __10__ times per set. Do _3___ sets per session.    Copyright  VHI. All rights reserved.     ROM: Flexion - Wand (Supine)     You do not have to hold th wand.  Towel roll behind your shoulder blades.  Lie on back. Raise arms over head to feel a moderate pressure behind you. Hold for 5 seconds.  Repeat __10__ times per set. Do ___3_ sets per session. Do ___2_ sessions per day.  http://orth.exer.us/929   Copyright  VHI. All rights reserved.    Opposite Arm / Leg Lift (Prone)    Abdomen and head supported, left knee locked, raise leg and opposite arm  from bed or floor. Hold for 5 seconds so long as it does not bother you. Repeat for your other side.  Repeat ___10_ times per set. Do _2___ sets per session. Do _2___ sessions per day.  http://orth.exer.us/1114   Copyright  VHI. All rights reserved.       Transversus abdominis contraction.    Pretend that there is a string attached from one side of your pelvis to the other.    Tighten that "string."   Hold for 5 seconds.   Perform throughout the day.      Also gave supine hip fallouts 10x3 (5-10 seconds going down and up) 3x/day, 5 days per week. Handout provided. Pt demonstrated and verbalized understanding.

## 2016-04-01 ENCOUNTER — Other Ambulatory Visit: Payer: Self-pay | Admitting: *Deleted

## 2016-04-01 DIAGNOSIS — Z86718 Personal history of other venous thrombosis and embolism: Secondary | ICD-10-CM

## 2016-04-03 ENCOUNTER — Other Ambulatory Visit: Payer: Self-pay | Admitting: Family Medicine

## 2016-04-03 NOTE — Telephone Encounter (Signed)
approved

## 2016-04-16 ENCOUNTER — Ambulatory Visit: Payer: No Typology Code available for payment source

## 2016-04-28 ENCOUNTER — Ambulatory Visit: Payer: No Typology Code available for payment source | Attending: Family Medicine

## 2016-04-28 ENCOUNTER — Telehealth: Payer: Self-pay

## 2016-04-28 NOTE — Telephone Encounter (Signed)
No show. Called and left a message pertaining to today's session and a reminder for his next follow up appointment. Return phone call requested.

## 2016-04-30 ENCOUNTER — Ambulatory Visit: Payer: No Typology Code available for payment source

## 2016-04-30 ENCOUNTER — Telehealth: Payer: Self-pay

## 2016-04-30 NOTE — Telephone Encounter (Signed)
Pt returned phone call. Nathaniel SpanielSaid that he does not have his insurance information yet so take him off the schedule for December 2017. Will call back when he gets more information. Might be in January 2018. Also has a hard time doing his whole HEP routine every day due to his busy schedule. Pt was recommended to divide his HEP by half. Do half one day and the other half the other day. Pt verbalized understanding.

## 2016-04-30 NOTE — Telephone Encounter (Signed)
No show. Called and left a message pertaining to today's session and a reminder for his next scheduled appointment. Return phone call requested (phone number [phone] provided)

## 2016-05-05 ENCOUNTER — Ambulatory Visit: Payer: No Typology Code available for payment source

## 2016-06-30 ENCOUNTER — Other Ambulatory Visit: Payer: No Typology Code available for payment source

## 2016-06-30 ENCOUNTER — Ambulatory Visit: Payer: No Typology Code available for payment source | Admitting: Hematology and Oncology

## 2016-07-10 ENCOUNTER — Other Ambulatory Visit: Payer: Self-pay

## 2016-07-10 DIAGNOSIS — N182 Chronic kidney disease, stage 2 (mild): Secondary | ICD-10-CM

## 2016-07-14 ENCOUNTER — Inpatient Hospital Stay (HOSPITAL_BASED_OUTPATIENT_CLINIC_OR_DEPARTMENT_OTHER): Payer: BC Managed Care – PPO | Admitting: Hematology and Oncology

## 2016-07-14 ENCOUNTER — Encounter: Payer: Self-pay | Admitting: Hematology and Oncology

## 2016-07-14 ENCOUNTER — Inpatient Hospital Stay: Payer: BC Managed Care – PPO | Attending: Hematology and Oncology

## 2016-07-14 VITALS — BP 125/78 | HR 65 | Temp 97.4°F | Resp 18 | Wt 222.2 lb

## 2016-07-14 DIAGNOSIS — Z79899 Other long term (current) drug therapy: Secondary | ICD-10-CM | POA: Insufficient documentation

## 2016-07-14 DIAGNOSIS — Z7901 Long term (current) use of anticoagulants: Secondary | ICD-10-CM | POA: Insufficient documentation

## 2016-07-14 DIAGNOSIS — Z8042 Family history of malignant neoplasm of prostate: Secondary | ICD-10-CM | POA: Insufficient documentation

## 2016-07-14 DIAGNOSIS — Z8673 Personal history of transient ischemic attack (TIA), and cerebral infarction without residual deficits: Secondary | ICD-10-CM

## 2016-07-14 DIAGNOSIS — R7303 Prediabetes: Secondary | ICD-10-CM | POA: Diagnosis not present

## 2016-07-14 DIAGNOSIS — Z87891 Personal history of nicotine dependence: Secondary | ICD-10-CM | POA: Diagnosis not present

## 2016-07-14 DIAGNOSIS — Z7982 Long term (current) use of aspirin: Secondary | ICD-10-CM | POA: Diagnosis not present

## 2016-07-14 DIAGNOSIS — Z803 Family history of malignant neoplasm of breast: Secondary | ICD-10-CM | POA: Diagnosis not present

## 2016-07-14 DIAGNOSIS — Z86718 Personal history of other venous thrombosis and embolism: Secondary | ICD-10-CM | POA: Diagnosis not present

## 2016-07-14 DIAGNOSIS — I82541 Chronic embolism and thrombosis of right tibial vein: Secondary | ICD-10-CM

## 2016-07-14 LAB — CBC WITH DIFFERENTIAL/PLATELET
Basophils Absolute: 0 10*3/uL (ref 0–0.1)
Basophils Relative: 1 %
Eosinophils Absolute: 0.1 10*3/uL (ref 0–0.7)
Eosinophils Relative: 2 %
HCT: 39.3 % — ABNORMAL LOW (ref 40.0–52.0)
Hemoglobin: 13.5 g/dL (ref 13.0–18.0)
Lymphocytes Relative: 38 %
Lymphs Abs: 1.6 10*3/uL (ref 1.0–3.6)
MCH: 29 pg (ref 26.0–34.0)
MCHC: 34.3 g/dL (ref 32.0–36.0)
MCV: 84.6 fL (ref 80.0–100.0)
Monocytes Absolute: 0.2 10*3/uL (ref 0.2–1.0)
Monocytes Relative: 5 %
Neutro Abs: 2.4 10*3/uL (ref 1.4–6.5)
Neutrophils Relative %: 54 %
Platelets: 225 10*3/uL (ref 150–440)
RBC: 4.64 MIL/uL (ref 4.40–5.90)
RDW: 14.1 % (ref 11.5–14.5)
WBC: 4.3 10*3/uL (ref 3.8–10.6)

## 2016-07-14 LAB — COMPREHENSIVE METABOLIC PANEL
ALT: 22 U/L (ref 17–63)
AST: 26 U/L (ref 15–41)
Albumin: 4.2 g/dL (ref 3.5–5.0)
Alkaline Phosphatase: 60 U/L (ref 38–126)
Anion gap: 6 (ref 5–15)
BUN: 16 mg/dL (ref 6–20)
CO2: 26 mmol/L (ref 22–32)
Calcium: 9.2 mg/dL (ref 8.9–10.3)
Chloride: 107 mmol/L (ref 101–111)
Creatinine, Ser: 1.55 mg/dL — ABNORMAL HIGH (ref 0.61–1.24)
GFR calc Af Amer: 54 mL/min — ABNORMAL LOW (ref >60)
GFR calc non Af Amer: 47 mL/min — ABNORMAL LOW (ref >60)
Glucose, Bld: 92 mg/dL (ref 65–99)
Potassium: 4.3 mmol/L (ref 3.5–5.1)
Sodium: 139 mmol/L (ref 135–145)
Total Bilirubin: 0.6 mg/dL (ref 0.3–1.2)
Total Protein: 7.2 g/dL (ref 6.5–8.1)

## 2016-07-14 NOTE — Progress Notes (Signed)
Patient is wearing compression socks and wants to know if that is okay. States his legs feel good. Also wants to know if he can exercise now.

## 2016-07-14 NOTE — Progress Notes (Signed)
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   HPI: The patient was last seen in the hematology clinic on 03/31/2016.  At that time, he was doing well.  Exam is unremarkable.  We reviewed his partial thrombophilia work-up.  No abnormalities were detected.  We discussed additional testing in future.  We discussed life long anticoagulation secondary to recurrent thrombosis.    During the interim, he has done well.  He remains on [name].  He denies any bruising or bleeding.  He is wearing compression socks.  His legs feel good.   Past Medical History:  Diagnosis Date  . CKD (chronic kidney disease) stage 2, GFR 60-89 ml/min   . Gout   . History of DVT (deep vein thrombosis)    DVT right lower extremity in 2003 on coumadin for 3 months, INR levels subtherapeutic, started having slurred words and memory issues, extensive TIA/CVA work up done, no residual deficits. Switched to asa plus plavix ever since and has done well thus far.  Marland Kitchen History of elevated PSA    Has seen Urologist in [place]: Prostate Biopsy Benign (Specialist wanted to do biopsies q6 months but patient did not think this was necessary and would rather monitor with annual PSA's)  . History of TIA (transient ischemic attack)   . Pre-diabetes     Past Surgical History:  Procedure Laterality Date  . BIOPSY PROSTATE  ?2012   neg    Family History  Problem Relation Age of Onset  . Kidney disease Mother     Dialysis  . Cancer Mother     Breast  . Diabetes Father   . Cancer Father 31    Prostate    Social History:  reports that he has never smoked. He has never used smokeless tobacco. He reports that he drinks alcohol. He reports that he does not use drugs.  He smoked for 4 months in 1991.  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  He has 2 daughters (age 67 and 67) who were both in the Olympics (track and volleyball).  The patient is alone today.  Allergies: No Known Allergies  Current Medications: Current Outpatient Prescriptions  Medication Sig Dispense Refill  . aspirin 81 MG chewable tablet Chew by mouth.    Arne Cleveland 5 MG TABS tablet TAKE 1 TABLET BY MOUTH TWICE DAILY 60 tablet 2   No current facility-administered medications for this visit.     Review of Systems:  GENERAL:  Feels good.  Active.  No fevers or sweats.  Weight down 3 pounds. PERFORMANCE STATUS (ECOG):  0 HEENT:  No visual changes, runny nose, sore throat, mouth sores or tenderness. Lungs: No shortness of breath or cough.  No hemoptysis. Cardiac:  No chest pain, palpitations, orthopnea, or PND. GI:  No nausea, vomiting, diarrhea, constipation, melena or hematochezia.  Last colonoscopy 11 years ago. GU:  No urgency, frequency, dysuria, or hematuria.  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 Musculoskeletal:  No back pain.  No joint pain.  No muscle tenderness. Extremities:  No pain or swelling. Skin:  No rashes or skin changes. Neuro:  No headache, numbness or weakness, balance or coordination issues. Endocrine:  No diabetes, thyroid issues, hot flashes or night sweats. Psych:  No mood changes, depression or anxiety. Pain:  No focal pain. Review of systems:  All other systems reviewed and found to be negative.  Physical Exam: Blood pressure 125/78, pulse 65, temperature 97.4 F (36.3 C), temperature source Tympanic, resp. rate 18, weight 222 lb 3.6 oz (100.8 kg). GENERAL:  Well developed, well nourished, gentleman sitting comfortably in the exam room in no acute distress. MENTAL STATUS:  Alert and oriented to person, place and time. HEAD:  Alopecia.  Slight mustache.  Small left ear ring.  Normocephalic, atraumatic, face symmetric, no Cushingoid features. EYES:   Glasses.  Brown eyes.  Pupils equal round and reactive to light and accomodation.  No conjunctivitis or scleral icterus. ENT:  Oropharynx clear without lesion.  Tongue normal. Mucous membranes moist.  RESPIRATORY:  Clear to auscultation without rales, wheezes or rhonchi. CARDIOVASCULAR:  Regular rate and rhythm without murmur, rub or gallop. ABDOMEN:  Soft, non-tender, with active bowel sounds, and no hepatosplenomegaly.  No masses. SKIN:  No rashes, ulcers or lesions. EXTREMITIES: No edema, no skin discoloration or tenderness.  No palpable cords. LYMPH NODES: No palpable cervical, supraclavicular, axillary or inguinal adenopathy  NEUROLOGICAL: Unremarkable. PSYCH:  Appropriate.   Appointment on 07/14/2016  Component Date Value Ref Range Status  . WBC 07/14/2016 4.3  3.8 - 10.6 K/uL Final  . RBC 07/14/2016 4.64  4.40 - 5.90 MIL/uL Final  . Hemoglobin 07/14/2016 13.5  13.0 - 18.0 g/dL Final  . HCT 07/14/2016 39.3* 40.0 - 52.0 % Final  . MCV 07/14/2016 84.6  80.0 - 100.0 fL Final  . MCH 07/14/2016 29.0  26.0 - 34.0 pg Final  . MCHC 07/14/2016 34.3  32.0 - 36.0 g/dL Final  . RDW 07/14/2016 14.1  11.5 - 14.5 % Final  . Platelets 07/14/2016 225  150 - 440 K/uL Final  . Neutrophils Relative % 07/14/2016 54  % Final  . Neutro Abs 07/14/2016 2.4  1.4 - 6.5 K/uL Final  . Lymphocytes Relative 07/14/2016 38  % Final  . Lymphs Abs 07/14/2016 1.6  1.0 - 3.6 K/uL Final  . Monocytes Relative 07/14/2016 5  % Final  . Monocytes Absolute 07/14/2016 0.2  0.2 - 1.0 K/uL Final  . Eosinophils Relative 07/14/2016 2  % Final  . Eosinophils Absolute 07/14/2016 0.1  0 - 0.7 K/uL Final  . Basophils Relative 07/14/2016 1  % Final  . Basophils Absolute 07/14/2016 0.0  0 - 0.1 K/uL Final  . Sodium 07/14/2016 139  135 - 145 mmol/L Final  . Potassium 07/14/2016 4.3  3.5 - 5.1 mmol/L Final  . Chloride 07/14/2016 107  101 - 111 mmol/L Final  . CO2 07/14/2016 26  22 - 32 mmol/L Final  . Glucose, Bld 07/14/2016 92   65 - 99 mg/dL Final  . BUN 07/14/2016 16  6 - 20 mg/dL Final  . Creatinine, Ser 07/14/2016 1.55* 0.61 - 1.24 mg/dL Final  . Calcium 07/14/2016 9.2  8.9 - 10.3 mg/dL Final  . Total Protein 07/14/2016 7.2  6.5 - 8.1 g/dL Final  . Albumin 07/14/2016 4.2  3.5 - 5.0 g/dL Final  . AST 07/14/2016 26  15 - 41 U/L Final  . ALT 07/14/2016 22  17 - 63 U/L Final  . Alkaline Phosphatase 07/14/2016 60  38 - 126 U/L Final  . Total Bilirubin 07/14/2016 0.6  0.3 - 1.2 mg/dL Final  . GFR calc non Af Amer 07/14/2016 47* >60 mL/min  Final  . GFR calc Af Amer 07/14/2016 54* >60 mL/min Final   Comment: (NOTE) The eGFR has been calculated using the CKD EPI equation. This calculation has not been validated in all clinical situations. eGFR's persistently <60 mL/min signify possible Chronic Kidney Disease.   . Anion gap 07/14/2016 6  5 - 15 Final    Assessment:  Nathaniel [name] is a 61 y.o. 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  Initial DVT was in his right calf approximately 13 years ago while in [place], [place]. He denied any precipitating events. He is extremely active. He was placed on Lovenox and converted to Coumadin.  He is unclear how long he was on Coumadin.  He then had an episode of disorientation years ago. He was diagnosed with a TIA. Coumadin was discontinued. He was placed on aspirin and Plavix.  He is off Plavix.  Right lower extremity duplex on 02/21/2016 revealed occlusive thrombus within one of the posterior tibial veins. There was apparent thrombus within additional veins at the popliteal fossa possibly reflecting gastrocnemius veins.  He is on aspirin and [name].   Hypercoagulable work-up on 03/11/2016 revealed the following normal studies:  CBC with diff, factor V Leiden, prothrombin gene mutation , lupus anticoagulant panel, anticardiolipin antibodies, and beta-2-glycoprotein antibodies.  PSA was 1.7 on 08/27/2015.  His last colonoscopy was 11 years ago (late).  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  He has no family history of thrombosis.  His father had prostate cancer.  Symptomatically, he feels well.  Exam is unremarkable.  Plan: 1.  Labs today: CBC with diff, CMP, protein C antigen/activity, protein S antigen/activity, ATIII antigen/activity. 2.  Continue [name].   3.  RTC in 3 months for labs (CBC, BMP). 4.  RTC in 6 months for MD assessment and labs (CBC with diff, CMP).   Lequita Asal, MD  07/14/2016, 4:42 PM

## 2016-07-15 ENCOUNTER — Telehealth: Payer: Self-pay | Admitting: Family Medicine

## 2016-07-15 DIAGNOSIS — N289 Disorder of kidney and ureter, unspecified: Secondary | ICD-10-CM | POA: Insufficient documentation

## 2016-07-15 LAB — PROTEIN S ACTIVITY: Protein S Activity: 83 % (ref 63–140)

## 2016-07-15 LAB — ANTITHROMBIN PANEL
AT III AG PPP IMM-ACNC: 95 % (ref 72–124)
Antithrombin Activity: 129 % (ref 75–135)

## 2016-07-15 LAB — PROTEIN C ACTIVITY: Protein C Activity: 106 % (ref 73–180)

## 2016-07-15 NOTE — Telephone Encounter (Signed)
-----   Message from Casimer Leekracie I Watson, RN sent at 07/15/2016  8:20 AM EST ----- Dr. Merlene Pullingorcoran wanted you to be aware of his increasing creatinine Thanks,  Canary Brimracie Watson RN

## 2016-07-15 NOTE — Assessment & Plan Note (Signed)
Recheck kidney function.  

## 2016-07-15 NOTE — Telephone Encounter (Signed)
Thank you to my heme-onc colleagues for letting me know about his creatinine and GFR. -------------------------------------- [facility] staff -- Please call the patient. Let him know that Dr. Merlene Pullingorcoran found that his kidney function has declined somewhat. I would like to recheck his kidneys in one week. Have him completely avoid NSAIDs and try to drink 64-72 ounces of water every day. I'll enter orders, and he can just stop by the lab here. Thank you

## 2016-07-16 ENCOUNTER — Other Ambulatory Visit: Payer: Self-pay

## 2016-07-16 DIAGNOSIS — N289 Disorder of kidney and ureter, unspecified: Secondary | ICD-10-CM

## 2016-07-16 LAB — PROTEIN C AG + PROTEIN S AG
Protein C, Total: 101 % (ref 60–150)
Protein S Ag, Free: 62 % (ref 57–157)
Protein S Ag, Total: 70 % (ref 60–150)

## 2016-07-17 NOTE — Telephone Encounter (Signed)
Patient has already been notified and will come in for recheck blood work.

## 2016-07-17 NOTE — Telephone Encounter (Signed)
Please see previous message thank you

## 2016-09-23 ENCOUNTER — Telehealth: Payer: Self-pay | Admitting: Family Medicine

## 2016-09-23 ENCOUNTER — Ambulatory Visit: Payer: No Typology Code available for payment source | Admitting: Family Medicine

## 2016-09-23 NOTE — Telephone Encounter (Signed)
I'll urge him to call urgent care back or return there

## 2016-09-23 NOTE — Telephone Encounter (Signed)
Pt went to urgent care on Sunday and was prescribed Colchicine .  due to gout flare up. Normally when he take the pills his gout gets better however it isnt getting any better. He has missed work for 2 days. They did not give him a steroid to go along with the medication. He took 2 pills of Colchicine at one time on Sunday and then take another pill a hour later. He was instructed not to take anymore until Wednesday, however due to the pain he did take one today. We are completely booked for today. Please advise [phone]

## 2016-09-23 NOTE — Telephone Encounter (Signed)
I called patient before realizing that he had already scheduled an appointment with you for tomorrow 09-24-16.

## 2016-09-24 ENCOUNTER — Encounter: Payer: Self-pay | Admitting: Family Medicine

## 2016-09-24 ENCOUNTER — Ambulatory Visit (INDEPENDENT_AMBULATORY_CARE_PROVIDER_SITE_OTHER): Payer: BC Managed Care – PPO | Admitting: Family Medicine

## 2016-09-24 DIAGNOSIS — E790 Hyperuricemia without signs of inflammatory arthritis and tophaceous disease: Secondary | ICD-10-CM

## 2016-09-24 DIAGNOSIS — M10071 Idiopathic gout, right ankle and foot: Secondary | ICD-10-CM

## 2016-09-24 DIAGNOSIS — N182 Chronic kidney disease, stage 2 (mild): Secondary | ICD-10-CM | POA: Diagnosis not present

## 2016-09-24 DIAGNOSIS — M109 Gout, unspecified: Secondary | ICD-10-CM | POA: Insufficient documentation

## 2016-09-24 LAB — BASIC METABOLIC PANEL
BUN: 14 mg/dL (ref 7–25)
CALCIUM: 9.1 mg/dL (ref 8.6–10.3)
CO2: 27 mmol/L (ref 20–31)
Chloride: 103 mmol/L (ref 98–110)
Creat: 1.22 mg/dL (ref 0.70–1.25)
GLUCOSE: 94 mg/dL (ref 65–99)
Potassium: 4.3 mmol/L (ref 3.5–5.3)
Sodium: 137 mmol/L (ref 135–146)

## 2016-09-24 MED ORDER — COLCHICINE 0.6 MG PO TABS: ORAL_TABLET | ORAL | 0 refills | Status: DC

## 2016-09-24 NOTE — Assessment & Plan Note (Addendum)
Check labs; will avoid NSAID until labs are back; mother on dialysis; if Cr persistently elevated, will refer to renal

## 2016-09-24 NOTE — Progress Notes (Signed)
BP 138/80   Pulse 79   Temp 97.6 F (36.4 C) (Oral)   Resp 14   Wt 222 lb 8 oz (100.9 kg)   SpO2 98%   BMI 27.08 kg/m    Subjective:    Patient ID: Nathaniel [name], male    DOB: 04-19-1956, 61 y.o.   MRN: [id number]  HPI: Nathaniel [name] is a 61 y.o. male  Chief Complaint  Patient presents with  . 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 CKD (chronic kidney disease) stage 2, GFR 60-89 ml/min   . Gout   . History of DVT (deep vein thrombosis)    DVT right lower extremity in 2003 on coumadin for 3 months, INR levels subtherapeutic, started having slurred words and memory issues, extensive TIA/CVA work up done, no residual deficits. Switched to asa plus plavix ever since and has done well thus far.  Marland Kitchen History of elevated PSA    Has seen Urologist in [place]: Prostate Biopsy Benign (Specialist wanted to do biopsies q6 months but patient did not think this was necessary and would rather monitor with annual PSA's)  . History of TIA (transient ischemic attack)   . Pre-diabetes    Past Surgical History:  Procedure Laterality Date  . BIOPSY PROSTATE  ?2012   neg   Family History  Problem Relation Age of Onset  . Kidney disease Mother       Dialysis  . Cancer Mother     Breast  . Diabetes Father   . Cancer Father 43    Prostate  . Gout Sister    Social History  Substance Use Topics  . Smoking status: Never Smoker  . Smokeless tobacco: Never Used  . Alcohol use 0.0 oz/week    Interim medical history since last visit reviewed. Allergies and medications reviewed  Review of Systems Per HPI unless specifically indicated above     Objective:    BP 138/80   Pulse 79   Temp 97.6 F (36.4 C) (Oral)   Resp 14   Wt 222 lb 8 oz (100.9 kg)   SpO2 98%   BMI 27.08 kg/m   Wt Readings from Last 3 Encounters:  09/24/16 222 lb 8 oz (100.9 kg)  07/14/16 222 lb 3.6 oz (100.8 kg)  03/31/16 225 lb 12 oz (102.4 kg)    Physical Exam  Constitutional: He appears well-developed and well-nourished. No distress.  Cardiovascular: Normal rate.   Pulses:      Dorsalis pedis pulses are 2+ on the right side.  Pulmonary/Chest: Effort normal.  Musculoskeletal:  Right foot: There is decreased range of motion, tenderness and swelling. There is no deformity.       Feet:  1st MTP on the RIGHT foot tender, swollen  Neurological: He is alert. He displays no tremor.  Skin: No pallor.  No evidence of foreign body entry on the right foot; sole of the foot appears normal   Results for orders placed or performed in visit on 07/14/16  CBC with Differential/Platelet  Result Value Ref Range   WBC 4.3 3.8 - 10.6 K/uL   RBC 4.64 4.40 - 5.90 MIL/uL   Hemoglobin 13.5 13.0 - 18.0 g/dL   HCT 95.6 (L) 21.3 - 08.6 %   MCV 84.6 80.0 - 100.0 fL   MCH 29.0 26.0 - 34.0 pg   MCHC 34.3 32.0 - 36.0 g/dL   RDW 57.8 46.9 - 62.9 %   Platelets 225 150 - 440 K/uL   Neutrophils Relative % 54 %   Neutro Abs 2.4 1.4 - 6.5 K/uL   Lymphocytes Relative 38 %   Lymphs Abs 1.6 1.0 - 3.6 K/uL   Monocytes Relative 5 %   Monocytes Absolute 0.2 0.2 - 1.0 K/uL   Eosinophils Relative 2 %   Eosinophils Absolute 0.1 0 - 0.7 K/uL   Basophils Relative 1 %    Basophils Absolute 0.0 0 - 0.1 K/uL  Comprehensive metabolic panel  Result Value Ref Range   Sodium 139 135 - 145 mmol/L   Potassium 4.3 3.5 - 5.1 mmol/L   Chloride 107 101 - 111 mmol/L   CO2 26 22 - 32 mmol/L   Glucose, Bld 92 65 - 99 mg/dL   BUN 16 6 - 20 mg/dL   Creatinine, Ser 5.28 (H) 0.61 - 1.24 mg/dL   Calcium 9.2 8.9 - 41.3 mg/dL   Total Protein 7.2 6.5 - 8.1 g/dL   Albumin 4.2 3.5 - 5.0 g/dL   AST 26 15 - 41 U/L   ALT 22 17 - 63 U/L   Alkaline Phosphatase 60 38 - 126 U/L   Total Bilirubin 0.6 0.3 - 1.2 mg/dL   GFR calc non Af Amer 47 (L) >60 mL/min   GFR calc Af Amer 54 (L) >60 mL/min   Anion gap 6 5 - 15  PROTEIN C AG + PROTEIN S AG  Result Value Ref Range   Protein C, Total 101 60 - 150 %   Protein S Ag, Total 70 60 - 150 %   Protein S Ag, Free 62 57 - 157 %  Protein C activity  Result Value Ref Range   Protein C Activity 106 73 - 180 %  Protein S activity  Result Value Ref Range   Protein S Activity 83 63 - 140 %  Antithrombin panel  Result Value Ref Range   Antithrombin Activity 129 75 - 135 %   AT III AG PPP IMM-ACNC 95 72 - 124 %      Assessment & Plan:   Problem List Items Addressed This Visit      Musculoskeletal and Integument   Gout of right foot    Will have him use colchicine; pointed out the elevated Cr last check with hematologist, so will see if renal insufficiency playing a part; I will actually avoid NSAID this time since I don't know current BUN/Cr      Relevant Medications   colchicine 0.6 MG tablet     Genitourinary   CKD (chronic kidney disease) stage 2, GFR 60-89 ml/min  Check labs; will avoid NSAID until labs are back; mother on dialysis; if Cr persistently elevated, will refer to renal      Relevant Orders   Basic Metabolic Panel (BMET)     Other   Elevated blood uric acid level    Check level; encouraged avoiding purine-rich foods; start back on tart cherry juice      Relevant Orders   Uric acid       Follow up  plan: No Follow-up on file.  An after-visit summary was printed and given to the patient at check-out.  Please see the patient instructions which may contain other information and recommendations beyond what is mentioned above in the assessment and plan.  Meds ordered this encounter  Medications  . DISCONTD: colchicine 0.6 MG tablet    Sig: Take 0.6 tablets by mouth daily.  . colchicine 0.6 MG tablet    Sig: Two pills by mouth now, then one pill one hour later    Dispense:  6 tablet    Refill:  0    Orders Placed This Encounter  Procedures  . Basic Metabolic Panel (BMET)  . Uric acid

## 2016-09-24 NOTE — Patient Instructions (Addendum)
Hydrochlorothiazide (HCTZ) can worsen gout  Start back on the colchicine today   Gout Gout is painful swelling that can occur in some of your joints. Gout is a type of arthritis. This condition is caused by having too much uric acid in your body. Uric acid is a chemical that forms when your body breaks down substances called purines. Purines are important for building body proteins. When your body has too much uric acid, sharp crystals can form and build up inside your joints. This causes pain and swelling. Gout attacks can happen quickly and be very painful (acute gout). Over time, the attacks can affect more joints and become more frequent (chronic gout). Gout can also cause uric acid to build up under your skin and inside your kidneys. What are the causes? This condition is caused by too much uric acid in your blood. This can occur because:  Your kidneys do not remove enough uric acid from your blood. This is the most common cause.  Your body makes too much uric acid. This can occur with some cancers and cancer treatments. It can also occur if your body is breaking down too many red blood cells (hemolytic anemia).  You eat too many foods that are high in purines. These foods include organ meats and some seafood. Alcohol, especially beer, is also high in purines. A gout attack may be triggered by trauma or stress. What increases the risk? This condition is more likely to develop in people who:  Have a family history of gout.  Are male and middle-aged.  Are male and have gone through menopause.  Are obese.  Frequently drink alcohol, especially beer.  Are dehydrated.  Lose weight too quickly.  Have an organ transplant.  Have lead poisoning.  Take certain medicines, including aspirin, cyclosporine, diuretics, levodopa, and niacin.  Have kidney disease or psoriasis. What are the signs or symptoms? An attack of acute gout happens quickly. It usually occurs in just one joint.  The most common place is the big toe. Attacks often start at night. Other joints that may be affected include joints of the feet, ankle, knee, fingers, wrist, or elbow. Symptoms may include:  Severe pain.  Warmth.  Swelling.  Stiffness.  Tenderness. The affected joint may be very painful to touch.  Shiny, red, or purple skin.  Chills and fever. Chronic gout may cause symptoms more frequently. More joints may be involved. You may also have white or yellow lumps (tophi) on your hands or feet or in other areas near your joints. How is this diagnosed? This condition is diagnosed based on your symptoms, medical history, and physical exam. You may have tests, such as:  Blood tests to measure uric acid levels.  Removal of joint fluid with a needle (aspiration) to look for uric acid crystals.  X-rays to look for joint damage. How is this treated? Treatment for this condition has two phases: treating an acute attack and preventing future attacks. Acute gout treatment may include medicines to reduce pain and swelling, including:  NSAIDs.  Steroids. These are strong anti-inflammatory medicines that can be taken by mouth (orally) or injected into a joint.  Colchicine. This medicine relieves pain and swelling when it is taken soon after an attack. It can be given orally or through an IV tube. Preventive treatment may include:  Daily use of smaller doses of NSAIDs or colchicine.  Use of a medicine that reduces uric acid levels in your blood.  Changes to your diet. You may  need to see a specialist about healthy eating (dietitian). Follow these instructions at home: During a Gout Attack   If directed, apply ice to the affected area:  Put ice in a plastic bag.  Place a towel between your skin and the bag.  Leave the ice on for 20 minutes, 2-3 times a day.  Rest the joint as much as possible. If the affected joint is in your leg, you may be given crutches to use.  Raise (elevate)  the affected joint above the level of your heart as often as possible.  Drink enough fluids to keep your urine clear or pale yellow.  Take over-the-counter and prescription medicines only as told by your health care provider.  Do not drive or operate heavy machinery while taking prescription pain medicine.  Follow instructions from your health care provider about eating or drinking restrictions.  Return to your normal activities as told by your health care provider. Ask your health care provider what activities are safe for you. Avoiding Future Gout Attacks   Follow a low-purine diet as told by your dietitian or health care provider. Avoid foods and drinks that are high in purines, including liver, kidney, anchovies, asparagus, herring, mushrooms, mussels, and beer.  Limit alcohol intake to no more than 1 drink a day for nonpregnant women and 2 drinks a day for men. One drink equals 12 oz of beer, 5 oz of wine, or 1 oz of hard liquor.  Maintain a healthy weight or lose weight if you are overweight. If you want to lose weight, talk with your health care provider. It is important that you do not lose weight too quickly.  Start or maintain an exercise program as told by your health care provider.  Drink enough fluids to keep your urine clear or pale yellow.  Take over-the-counter and prescription medicines only as told by your health care provider.  Keep all follow-up visits as told by your health care provider. This is important. Contact a health care provider if:  You have another gout attack.  You continue to have symptoms of a gout attack after10 days of treatment.  You have side effects from your medicines.  You have chills or a fever.  You have burning pain when you urinate.  You have pain in your lower back or belly. Get help right away if:  You have severe or uncontrolled pain.  You cannot urinate. This information is not intended to replace advice given to you by  your health care provider. Make sure you discuss any questions you have with your health care provider. Document Released: 05/09/2000 Document Revised: 10/18/2015 Document Reviewed: 02/22/2015 Elsevier Interactive Patient Education  2017 [employer].

## 2016-09-24 NOTE — Assessment & Plan Note (Addendum)
Check level; encouraged avoiding purine-rich foods; start back on tart cherry juice

## 2016-09-24 NOTE — Assessment & Plan Note (Signed)
Will have him use colchicine; pointed out the elevated Cr last check with hematologist, so will see if renal insufficiency playing a part; I will actually avoid NSAID this time since I don't know current BUN/Cr

## 2016-09-25 LAB — URIC ACID: URIC ACID, SERUM: 7.5 mg/dL (ref 4.0–8.0)

## 2016-09-29 ENCOUNTER — Telehealth: Payer: Self-pay | Admitting: Family Medicine

## 2016-09-29 ENCOUNTER — Other Ambulatory Visit: Payer: Self-pay

## 2016-09-29 NOTE — Telephone Encounter (Signed)
That's fine

## 2016-09-29 NOTE — Telephone Encounter (Signed)
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 Pt did not go to work today because his job requires him to be on his feet all day and patient felt like he needed extended time off his feet. Please advise. Note can be faxed to [phone]

## 2016-09-29 NOTE — Telephone Encounter (Signed)
Done and faxed

## 2016-10-13 ENCOUNTER — Inpatient Hospital Stay: Payer: BC Managed Care – PPO | Attending: Hematology and Oncology

## 2016-12-15 DIAGNOSIS — M545 Low back pain: Principal | ICD-10-CM

## 2016-12-15 DIAGNOSIS — M5416 Radiculopathy, lumbar region: Secondary | ICD-10-CM

## 2016-12-15 DIAGNOSIS — G8929 Other chronic pain: Secondary | ICD-10-CM

## 2016-12-15 NOTE — Patient Instructions (Addendum)
Caudal Rotation: Hip Roll, Neutral Lordosis - Supine   Lie with knees bent, feet flat. Lower knees out to right side, rotating hips and trunk. Hold for 5 seconds. Return. Repeat for other side. Repeat __10__ times per set. Do _3___ sets per session.    Copyright  VHI. All rights reserved.     ROM: Flexion - Wand (Supine)     You do not have to hold th wand.  Towel roll behind your shoulder blades.  Lie on back. Raise arms over head to feel a moderate pressure behind you. Hold for 5 seconds.  Repeat __10__ times per set. Do ___3_ sets per session. Do ___2_ sessions per day.  http://orth.exer.us/929   Copyright  VHI. All rights reserved.    Opposite Arm / Leg Lift (Prone)    Abdomen and head supported, left knee locked, raise leg and opposite arm  from bed or floor. Hold for 5 seconds so long as it does not bother you. Repeat for your other side.  Repeat ___10_ times per set. Do _2___ sets per session. Do _2___ sessions per day.  http://orth.exer.us/1114   Copyright  VHI. All rights reserved.       Transversus abdominis contraction.               Pretend that there is a string attached from one side of your pelvis to the other.               Tighten that "string."              Hold for 5 seconds.              Perform throughout the day.      Sitting on a chair:   Rotate your trunk from one side to the other.    Repeat 10 times   Perform 3 sets daily     Also use a lumbar towel roll when sitting on a chair when not performing exercises.

## 2016-12-15 NOTE — Therapy (Signed)
[facility] [address], [place], [address] Phone: [phone]   Fax:  [phone]  Physical Therapy Discharge Summary   Patient Details  Name: Nathaniel [name] MRN: [id number] Date of Birth: 31-Oct-1955 Referring Provider: Enid Derry, MD  Encounter Date: 12/15/2016    Past Medical History:  Diagnosis Date  . CKD (chronic kidney disease) stage 2, GFR 60-89 ml/min   . Gout   . History of DVT (deep vein thrombosis)    DVT right lower extremity in 2003 on coumadin for 3 months, INR levels subtherapeutic, started having slurred words and memory issues, extensive TIA/CVA work up done, no residual deficits. Switched to asa plus plavix ever since and has done well thus far.  Marland Kitchen History of elevated PSA    Has seen Urologist in [place]: Prostate Biopsy Benign (Specialist wanted to do biopsies q6 months but patient did not think this was necessary and would rather monitor with annual PSA's)  . History of TIA (transient ischemic attack)   . Pre-diabetes     Past Surgical History:  Procedure Laterality Date  . BIOPSY PROSTATE  ?2012   neg    There were no vitals filed for this visit.      Subjective Assessment - 12/15/16 1550    Subjective Patient came in to get a printout of all his exercises. Pt states that he is doing well. Has no pain and no radiating symptoms when doing his exercises.  Also back to running and working out at the gym.    Pertinent History Low Back Pain. Pt states that he was in the [employer]. Did not notice back pain until the last 5-6 years. Symptoms include stiffness. 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 Denies bowel or bladder problems or saddle anesthesia. Feels radiating symptoms R lateral LE. Does 5 stretches and walks which help him feel better. Movement helps. Stretches include supine posterior pelvic tilts,  bridges, cat/camel, standing forward trunk flexion, side bending.  Walking 2 miles helps.  Has a DVT diagnosis 02/21/2016. Was told that his clot would be dissolved in a month after taking Elequis.  Pt states not having pain or cramps this time around but got a cut on his leg while moving.  Pt states that his oncologist measured his clotting factors whch came back negative.  Pt denies personal hx of CA.    Patient Stated Goals I'd Like to strengthen it (the muscles) to have less pain   Currently in Pain? No/denies   Pain Onset More than a month ago                                      PT Long Term Goals - 12/15/16 1549      PT LONG TERM GOAL #1   Title Patient will have decraease in low back pain to 3/10 or less at worst to promote ability to perform functional tasks as well as improve ability to drive more than an hour and a half.    Baseline 8/10 at worst; 2/10 at worst when not doing exercises. No pain when doing exercises. (12/15/2016)   Time 4   Period Weeks   Status Achieved     PT LONG TERM GOAL #2   Title Patient will improve his Modified Oswestry Low Back Pain  Disability score by at least 8% as a demonstration of improved function.    Baseline 12%; 4% (12/15/2016)   Time 4   Period Weeks   Status Achieved     PT LONG TERM GOAL #3   Title Patient will report less difficulty with getting up from a prolonged sitting or lying position to promote mobility.    Baseline Difficulty getting up after sitting or lying for long periods due to back pain; Less difficulty when performing aforementioned tasks when he does his exercises. (12/15/2016).    Time 4   Period Weeks   Status Achieved               Plan - 12/15/16 1557    Clinical Impression Statement Pt returned to clinic secondary to needing another copy of his home exercise routine. Per pt, he does not have back pain or radiating symptoms when performing exercises. He has also returned to playing  basketball, working out at [employer], and back to running. Still taking Elequis and being monitored by his doctor for his DVT. Patient demonstrates significant decrease in low back pain level, improved ability to perform functional tasks and is able to perform transfers with less difficulty from his back and LE symptoms.  Patient has progressed very well with physical therapy and has met all goals. Skilled physical therapy services discharged with patient continuing progress with his HEP.    History and Personal Factors relevant to plan of care: please see eval   Clinical Presentation Stable   Clinical Presentation due to: no back pain or LE symptoms when pt performs exercises   Clinical Decision Making Low   Rehab Potential Good   Clinical Impairments Affecting Rehab Potential R LE DVT (currently taking Elequis and is being monitored by his MD)   PT Treatment/Interventions Neuromuscular re-education;Patient/family education;Therapeutic exercise;Therapeutic activities   PT Next Visit Plan Continue progress with his home exercise program.    Consulted and Agree with Plan of Care Patient      Patient will benefit from skilled therapeutic intervention in order to improve the following deficits and impairments:  Pain, Improper body mechanics, Decreased mobility  Visit Diagnosis: Chronic bilateral low back pain, with sciatica presence unspecified  Radiculopathy, lumbar region     Problem List Patient Active Problem List   Diagnosis Date Noted  . Gout of right foot 09/24/2016  . Renal insufficiency 07/15/2016  . Foraminal stenosis of lumbar region 02/26/2016  . Degenerative disc disease, lumbar 02/26/2016  . Deep venous thrombosis of right tibial vein ([place]) 02/21/2016  . Elevated blood uric acid level 11/20/2015  . Annual physical exam 07/06/2015  . Elevated PSA, less than 10 ng/ml 07/06/2015  . Encounter for screening for malignant neoplasm of colon 07/06/2015  . Need for Tdap vaccination  07/06/2015  . Lumbar back pain 07/06/2015  . Hematospermia 06/11/2015  . CKD (chronic kidney disease) stage 2, GFR 60-89 ml/min 06/06/2015  . Prediabetes 06/06/2015  . History of TIA (transient ischemic attack) 06/06/2015  . History of elevated PSA   . History of DVT (deep vein thrombosis)     Thank you for your referral.  Joneen Boers PT, DPT   12/15/2016, 4:05 PM  [facility] [address], [place], [address] Phone: [phone]   Fax:  [phone]  Name: Nathaniel [name] MRN: [id number] Date of Birth: 1956-02-04

## 2016-12-29 ENCOUNTER — Encounter: Payer: Self-pay | Admitting: Hematology and Oncology

## 2016-12-29 ENCOUNTER — Encounter: Payer: Self-pay | Admitting: Family Medicine

## 2016-12-29 DIAGNOSIS — K6289 Other specified diseases of anus and rectum: Secondary | ICD-10-CM | POA: Insufficient documentation

## 2016-12-31 ENCOUNTER — Encounter: Payer: Self-pay | Admitting: *Deleted

## 2016-12-31 ENCOUNTER — Ambulatory Visit: Payer: Self-pay | Admitting: General Surgery

## 2017-01-08 ENCOUNTER — Ambulatory Visit (INDEPENDENT_AMBULATORY_CARE_PROVIDER_SITE_OTHER): Payer: BC Managed Care – PPO | Admitting: General Surgery

## 2017-01-08 ENCOUNTER — Encounter: Payer: Self-pay | Admitting: General Surgery

## 2017-01-08 VITALS — BP 140/80 | HR 98 | Resp 14 | Ht 76.0 in | Wt 232.0 lb

## 2017-01-08 DIAGNOSIS — K644 Residual hemorrhoidal skin tags: Secondary | ICD-10-CM | POA: Diagnosis not present

## 2017-01-08 NOTE — Progress Notes (Signed)
Patient ID: Nathaniel GlazierRicky [name], male   DOB: 1956-02-06, 61 y.o.   MRN: [id number]  Chief Complaint  Patient presents with  . Rectal Problems    HPI Nathaniel GlazierRicky [name] is a 61 y.o. male.  Here for evaluation of a rectal bump referred by Dr Sherie DonLada. He states it has been there for 6 months. No pain, no bleeding. He states his wife made him come. Bowels move daily.  HPI  Past Medical History:  Diagnosis Date  . CKD (chronic kidney disease) stage 2, GFR 60-89 ml/min   . Gout   . History of DVT (deep vein thrombosis)    DVT right lower extremity in 2003 on coumadin for 3 months, INR levels subtherapeutic, started having slurred words and memory issues, extensive TIA/CVA work up done, no residual deficits. Switched to asa plus plavix ever since and has done well thus far.  Marland Kitchen. History of elevated PSA    Has seen Urologist in [place]: Prostate Biopsy Benign (Specialist wanted to do biopsies q6 months but patient did not think this was necessary and would rather monitor with annual PSA's)  . History of TIA (transient ischemic attack)   . Pre-diabetes     Past Surgical History:  Procedure Laterality Date  . BIOPSY PROSTATE  ?2012   neg  . [place]    Family History  Problem Relation Age of Onset  . Kidney disease Mother        Dialysis  . Cancer Mother        Breast  . Diabetes Father   . Cancer Father [phone]       Prostate  . Gout Sister     Social History Social History  Substance Use Topics  . Smoking status: Never Smoker  . Smokeless tobacco: Never Used  . Alcohol use 0.0 oz/week    No Known Allergies  Current Outpatient Prescriptions  Medication Sig Dispense Refill  . aspirin 81 MG chewable tablet Chew by mouth.    . colchicine 0.6 MG tablet Two pills by mouth now, then one pill one hour later (Patient taking differently: as needed. Two pills by mouth now, then one pill one hour later) 6 tablet 0  . ELIQUIS 5 MG TABS tablet TAKE 1 TABLET BY MOUTH TWICE DAILY 60 tablet  2   No current facility-administered medications for this visit.     Review of Systems Review of Systems  Constitutional: Negative.   Respiratory: Negative.   Cardiovascular: Negative.     Blood pressure 140/80, pulse 98, resp. rate 14, height 6\' 4"  (1.93 m), weight 232 lb (105.2 kg).  Physical Exam Physical Exam  Constitutional: He is oriented to person, place, and time. He appears well-developed and well-nourished.  HENT:  Mouth/Throat: Oropharynx is clear and moist.  Abdominal: No hernia.  Genitourinary:     Neurological: He is alert and oriented to person, place, and time.  Skin: Skin is warm and dry.  Psychiatric: He has a normal mood and affect.  Digital Rectal Exam: no abnormal findings  Data Reviewed none  Assessment    External hemorrhoid - possibly an organized clot. Not symptomatic    Plan    Recheck in 6 weeks. Patient advised      HPI, Physical Exam, Assessment and Plan have been scribed under the direction and in the presence of Kathreen CosierS. G. Declin Rajan, MD Dorathy DaftMarsha Hatch, RN I have completed the exam and reviewed the above documentation for accuracy and completeness.  I agree  with the above.  Museum/gallery conservator has been used and any errors in dictation or transcription are unintentional.  Azeez Dunker G. Evette Cristal, M.D., F.A.C.S.  Gerlene Burdock G 01/12/2017, 8:26 AM

## 2017-01-08 NOTE — Patient Instructions (Signed)
Return in 6 weeks for reevaluation and possible excision.  The patient is aware to call back for any questions or concerns.

## 2017-01-09 ENCOUNTER — Ambulatory Visit: Payer: BC Managed Care – PPO | Admitting: Hematology and Oncology

## 2017-01-09 ENCOUNTER — Other Ambulatory Visit: Payer: BC Managed Care – PPO

## 2017-01-09 ENCOUNTER — Telehealth: Payer: Self-pay | Admitting: *Deleted

## 2017-01-09 NOTE — Telephone Encounter (Signed)
Called pt to speak to him about renewing the eliquis asst for free drug.  His current enrollment thru [facility] expires 03/19/2017 and if he need to renew then we need to start working on it.  I noticed that he has insurance and I wanted to check with him first.  Patient called me back and let me know that he is still getting asst but he will not need to renew. He checked with his new insurance and he can get 90 day supply of med for $30.00.  He will start using [facility] in [place].  I told him that when he needs refills he should call us and remind them of this conversation.  He will see md on 24th.

## 2017-01-12 ENCOUNTER — Ambulatory Visit: Payer: BC Managed Care – PPO | Admitting: Hematology and Oncology

## 2017-01-12 ENCOUNTER — Other Ambulatory Visit: Payer: BC Managed Care – PPO

## 2017-01-16 ENCOUNTER — Inpatient Hospital Stay: Payer: BC Managed Care – PPO | Attending: Hematology and Oncology | Admitting: Hematology and Oncology

## 2017-01-16 ENCOUNTER — Inpatient Hospital Stay: Payer: BC Managed Care – PPO

## 2017-01-16 VITALS — BP 127/83 | HR 139 | Temp 97.4°F | Wt 232.5 lb

## 2017-01-16 DIAGNOSIS — R7303 Prediabetes: Secondary | ICD-10-CM | POA: Insufficient documentation

## 2017-01-16 DIAGNOSIS — Z87891 Personal history of nicotine dependence: Secondary | ICD-10-CM | POA: Insufficient documentation

## 2017-01-16 DIAGNOSIS — Z8673 Personal history of transient ischemic attack (TIA), and cerebral infarction without residual deficits: Secondary | ICD-10-CM | POA: Insufficient documentation

## 2017-01-16 DIAGNOSIS — I82541 Chronic embolism and thrombosis of right tibial vein: Secondary | ICD-10-CM | POA: Insufficient documentation

## 2017-01-16 DIAGNOSIS — N182 Chronic kidney disease, stage 2 (mild): Secondary | ICD-10-CM | POA: Diagnosis not present

## 2017-01-16 DIAGNOSIS — Z79899 Other long term (current) drug therapy: Secondary | ICD-10-CM | POA: Diagnosis not present

## 2017-01-16 DIAGNOSIS — Z7982 Long term (current) use of aspirin: Secondary | ICD-10-CM | POA: Insufficient documentation

## 2017-01-16 DIAGNOSIS — Z7901 Long term (current) use of anticoagulants: Secondary | ICD-10-CM | POA: Diagnosis not present

## 2017-01-16 DIAGNOSIS — Z803 Family history of malignant neoplasm of breast: Secondary | ICD-10-CM | POA: Insufficient documentation

## 2017-01-16 DIAGNOSIS — Z8042 Family history of malignant neoplasm of prostate: Secondary | ICD-10-CM | POA: Insufficient documentation

## 2017-01-16 LAB — COMPREHENSIVE METABOLIC PANEL
ALT: 24 U/L (ref 17–63)
AST: 36 U/L (ref 15–41)
Albumin: 4.4 g/dL (ref 3.5–5.0)
Alkaline Phosphatase: 61 U/L (ref 38–126)
Anion gap: 9 (ref 5–15)
BUN: 16 mg/dL (ref 6–20)
CO2: 25 mmol/L (ref 22–32)
Calcium: 9.4 mg/dL (ref 8.9–10.3)
Chloride: 104 mmol/L (ref 101–111)
Creatinine, Ser: 1.37 mg/dL — ABNORMAL HIGH (ref 0.61–1.24)
GFR calc Af Amer: >60 mL/min (ref >60)
GFR calc non Af Amer: 54 mL/min — ABNORMAL LOW (ref >60)
Glucose, Bld: 102 mg/dL — ABNORMAL HIGH (ref 65–99)
Potassium: 4.3 mmol/L (ref 3.5–5.1)
Sodium: 138 mmol/L (ref 135–145)
Total Bilirubin: 1 mg/dL (ref 0.3–1.2)
Total Protein: 7.4 g/dL (ref 6.5–8.1)

## 2017-01-16 LAB — CBC WITH DIFFERENTIAL/PLATELET
Basophils Absolute: 0 10*3/uL (ref 0–0.1)
Basophils Relative: 1 %
Eosinophils Absolute: 0.1 10*3/uL (ref 0–0.7)
Eosinophils Relative: 3 %
HCT: 41 % (ref 40.0–52.0)
Hemoglobin: 14.2 g/dL (ref 13.0–18.0)
Lymphocytes Relative: 37 %
Lymphs Abs: 1.4 10*3/uL (ref 1.0–3.6)
MCH: 29.3 pg (ref 26.0–34.0)
MCHC: 34.6 g/dL (ref 32.0–36.0)
MCV: 84.6 fL (ref 80.0–100.0)
Monocytes Absolute: 0.2 10*3/uL (ref 0.2–1.0)
Monocytes Relative: 6 %
Neutro Abs: 2 10*3/uL (ref 1.4–6.5)
Neutrophils Relative %: 53 %
Platelets: 214 10*3/uL (ref 150–440)
RBC: 4.85 MIL/uL (ref 4.40–5.90)
RDW: 13.6 % (ref 11.5–14.5)
WBC: 3.8 10*3/uL (ref 3.8–10.6)

## 2017-01-16 NOTE — Progress Notes (Signed)
[facility] day:  01/16/2017  Chief Complaint: Nathaniel [name] is a 61 y.o. male with recurrent right lower extremity DVT who is seen for 6 month assessment on Eliquis.   HPI: The patient was last seen in the hematology clinic on  07/14/2016.  At that time, he was doing well on Eliquis.  His legs felt better with support stockings.  Exam is unremarkable.  Additional hypercoagulable work-up was sent: protein C total 101%, protein C activity 106%, protein S antigen total 70%, protein S antigen free 62%, protein S activity 83%, AT III activity 129%, and AT III antigen 95%.  Symptomatically, patient is "fantastic"; smiling and very talkative. Patient has no physical complaints of anything today. Patient continues Eliquis therapy. He denies any signs of active bleeding and areas of unexplained bruising. Patient denies pain in his legs; continues to use support stockings.    Past Medical History:  Diagnosis Date  . CKD (chronic kidney disease) stage 2, GFR 60-89 ml/min   . Gout   . History of DVT (deep vein thrombosis)    DVT right lower extremity in 2003 on coumadin for 3 months, INR levels subtherapeutic, started having slurred words and memory issues, extensive TIA/CVA work up done, no residual deficits. Switched to asa plus plavix ever since and has done well thus far.  Marland Kitchen History of elevated PSA    Has seen Urologist in [place]: Prostate Biopsy Benign (Specialist wanted to do biopsies q6 months but patient did not think this was necessary and would rather monitor with annual PSA's)  . History of TIA (transient ischemic attack)   . Pre-diabetes     Past Surgical History:  Procedure Laterality Date  . BIOPSY PROSTATE  ?2012   neg  . COLONOSCOPY  2007   in [place]    Family History  Problem Relation Age of Onset  . Kidney disease Mother        Dialysis  . Cancer Mother        Breast  . Diabetes Father   . Cancer Father 50       Prostate  . Gout  Sister     Social History:  reports that he has never smoked. He has never used smokeless tobacco. He reports that he drinks alcohol. He reports that he does not use drugs.  He smoked for 4 months in 1991.  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  He has 2 daughters (age 6 and 1) who were both in the Olympics (track and volleyball).  His daughter who plays volleyball has moved to [place]; she plans to play for [place] in the 2020 Olympics.  The patient is alone today.  Allergies: No Known Allergies  Current Medications: Current Outpatient Prescriptions  Medication Sig Dispense Refill  . aspirin 81 MG chewable tablet Chew by mouth.    . colchicine 0.6 MG tablet Two pills by mouth now, then one pill one hour later (Patient taking differently: as needed. Two pills by mouth now, then one pill one hour later) 6 tablet 0  . ELIQUIS 5 MG TABS tablet TAKE 1 TABLET BY MOUTH TWICE DAILY 60 tablet 2   No current facility-administered medications for this visit.     Review of Systems:  GENERAL:  Feels "fantastic".  Active.  No fevers or sweats.  Weight up 10 pounds in 6 months. PERFORMANCE STATUS (ECOG):  0 HEENT:  No visual changes, runny nose, sore throat, mouth sores or tenderness. Lungs: No shortness of breath or cough.  No hemoptysis. Cardiac:  No chest pain, palpitations, orthopnea, or PND. GI:  No nausea, vomiting, diarrhea, constipation, melena or hematochezia.  Last colonoscopy 11 years ago. GU:  No urgency, frequency, dysuria, or hematuria.   Musculoskeletal:  No back pain.  No joint pain.  No muscle tenderness. Extremities:  No pain or swelling. Skin:  No rashes or skin changes. Neuro:  No headache, numbness or weakness, balance or coordination issues. Endocrine:  No diabetes, thyroid issues, hot flashes or night sweats. Psych:  No mood changes, depression  or anxiety. Pain:  No focal pain. Review of systems:  All other systems reviewed and found to be negative.  Physical Exam: Blood pressure 127/83, pulse (!) 139, temperature (!) 97.4 F (36.3 C), temperature source Tympanic, weight 232 lb 8 oz (105.5 kg). GENERAL:  Well developed, well nourished, gentleman sitting comfortably in the exam room in no acute distress. MENTAL STATUS:  Alert and oriented to person, place and time. HEAD:  Alopecia.  Goatee.  Normocephalic, atraumatic, face symmetric, no Cushingoid features. EYES:  Glasses.  Brown eyes.  Pupils equal round and reactive to light and accomodation.  No conjunctivitis or scleral icterus. ENT:  Oropharynx clear without lesion.  Tongue normal. Mucous membranes moist.  RESPIRATORY:  Clear to auscultation without rales, wheezes or rhonchi. CARDIOVASCULAR:  Regular rate and rhythm without murmur, rub or gallop. ABDOMEN:  Soft, non-tender, with active bowel sounds, and no hepatosplenomegaly.  No masses. SKIN:  No rashes, ulcers or lesions. EXTREMITIES: No edema, no skin discoloration or tenderness.  No palpable cords. LYMPH NODES: No palpable cervical, supraclavicular, axillary or inguinal adenopathy  NEUROLOGICAL: Unremarkable. PSYCH:  Appropriate.   Appointment on 01/16/2017  Component Date Value Ref Range Status  . WBC 01/16/2017 3.8  3.8 - 10.6 K/uL Final  . RBC 01/16/2017 4.85  4.40 - 5.90 MIL/uL Final  . Hemoglobin 01/16/2017 14.2  13.0 - 18.0 g/dL Final  . HCT 01/16/2017 41.0  40.0 - 52.0 % Final  . MCV 01/16/2017 84.6  80.0 - 100.0 fL Final  . MCH 01/16/2017 29.3  26.0 - 34.0 pg Final  . MCHC 01/16/2017 34.6  32.0 - 36.0 g/dL Final  . RDW 01/16/2017 13.6  11.5 - 14.5 % Final  . Platelets 01/16/2017 214  150 - 440 K/uL Final  . Neutrophils Relative % 01/16/2017 53  % Final  . Neutro Abs 01/16/2017 2.0  1.4 - 6.5 K/uL Final  . Lymphocytes Relative 01/16/2017 37  % Final  . Lymphs Abs 01/16/2017 1.4  1.0 - 3.6 K/uL Final  .  Monocytes Relative 01/16/2017 6  % Final  . Monocytes Absolute 01/16/2017 0.2  0.2 - 1.0 K/uL Final  . Eosinophils Relative 01/16/2017 3  % Final  . Eosinophils Absolute 01/16/2017 0.1  0 - 0.7 K/uL Final  . Basophils Relative 01/16/2017 1  % Final  . Basophils Absolute 01/16/2017 0.0  0 - 0.1 K/uL Final  . Sodium 01/16/2017 138  135 - 145 mmol/L Final  . Potassium 01/16/2017 4.3  3.5 - 5.1 mmol/L Final  . Chloride 01/16/2017 104  101 - 111 mmol/L Final  . CO2 01/16/2017 25  22 - 32 mmol/L Final  . Glucose, Bld 01/16/2017 102* 65 - 99 mg/dL Final  . BUN 01/16/2017 16  6 - 20 mg/dL Final  .  Creatinine, Ser 01/16/2017 1.37* 0.61 - 1.24 mg/dL Final  . Calcium 01/16/2017 9.4  8.9 - 10.3 mg/dL Final  . Total Protein 01/16/2017 7.4  6.5 - 8.1 g/dL Final  . Albumin 01/16/2017 4.4  3.5 - 5.0 g/dL Final  . AST 01/16/2017 36  15 - 41 U/L Final  . ALT 01/16/2017 24  17 - 63 U/L Final  . Alkaline Phosphatase 01/16/2017 61  38 - 126 U/L Final  . Total Bilirubin 01/16/2017 1.0  0.3 - 1.2 mg/dL Final  . GFR calc non Af Amer 01/16/2017 54* >60 mL/min Final  . GFR calc Af Amer 01/16/2017 >60  >60 mL/min Final   Comment: (NOTE) The eGFR has been calculated using the CKD EPI equation. This calculation has not been validated in all clinical situations. eGFR's persistently <60 mL/min signify possible Chronic Kidney Disease.   . Anion gap 01/16/2017 9  5 - 15 Final    Assessment:  Nathaniel [name] is a 61 y.o. 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  Initial DVT was in his right calf approximately 13 years ago while in [place], [place]. He denied any precipitating events. He is extremely active. He was placed on Lovenox and converted to Coumadin.  He is unclear how long he was on Coumadin.  He then had an episode of disorientation years ago. He was diagnosed with a TIA. Coumadin was discontinued. He was placed on aspirin and Plavix.  He is off Plavix.  Right lower extremity duplex on 02/21/2016  revealed occlusive thrombus within one of the posterior tibial veins. There was apparent thrombus within additional veins at the popliteal fossa possibly reflecting gastrocnemius veins.  He is on aspirin and Eliquis.   Hypercoagulable work-up on 03/11/2016 revealed the following normal studies:  CBC with diff, factor V Leiden, prothrombin gene mutation , lupus anticoagulant panel, anticardiolipin antibodies, and beta-2-glycoprotein antibodies.  Protein C, protein S, and ATIII (activity and antigen) were normal on 07/14/2016.  PSA was 1.7 on 08/27/2015.  His last colonoscopy was 11 years ago (late).  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  He has no family history of thrombosis.  His father had prostate cancer.  Symptomatically, he feels well.  Exam is unremarkable. CBC and CMP are normal.   Plan: 1.  Labs today: CBC with diff, CMP. 2.  Review additional hypercoagulable work-up from 07/14/2016: no abnormality in protein C, protein S, and ATIII. 3.  Continue Eliquis.   4.  RTC in 3 months for labs (CBC, BMP). 5.  RTC in 6 months for MD assessment and labs (CBC with diff, CMP).   Melissa C. Mike Gip, MD  01/16/2017, 2:17 PM

## 2017-01-16 NOTE — Progress Notes (Signed)
Patient offers no complaints today. 

## 2017-01-17 ENCOUNTER — Encounter: Payer: Self-pay | Admitting: Hematology and Oncology

## 2017-01-19 ENCOUNTER — Telehealth: Payer: Self-pay | Admitting: *Deleted

## 2017-01-19 NOTE — Telephone Encounter (Signed)
Called and left patient message that we need to recheck his pulse rate. It was 139 on Friday.  Patient was given 2 options.  He is a principal at a school.  He can either have the school nurse take his pulse and call me with the results or come by the cancer center today for a recheck.  If he comes to the cancer center he is instructed to take the elevator and not the stairs.  He does not have to check in, but come up to the waiting area and let the scheduler know he is here to see Synetta Fail, Charity fundraiser.

## 2017-01-21 ENCOUNTER — Telehealth: Payer: Self-pay | Admitting: Family Medicine

## 2017-01-21 NOTE — Telephone Encounter (Signed)
Okay, thank you!

## 2017-01-21 NOTE — Telephone Encounter (Signed)
Pt was told to get a pulse reading from his nurse at work. States he did and it is 90. The nurse told him that it is regular.

## 2017-03-04 ENCOUNTER — Telehealth: Payer: Self-pay | Admitting: *Deleted

## 2017-03-04 ENCOUNTER — Ambulatory Visit: Payer: BC Managed Care – PPO | Admitting: General Surgery

## 2017-03-04 NOTE — Telephone Encounter (Signed)
Called patient and LVM that I received a fax from [employer] regarding patient assistance for Eliquis, that will expire on 03-18-17.  We can resubmit application for continued assistance, however, there is a portion of the application that has to be completed by the patient.  Informed him that we will need documentation of annual income.  Also informed him that I will be on vacation next week, therefore, needs to be addressed this week.  Asked patient to return my call tomorrow in the [place], office.

## 2017-03-19 ENCOUNTER — Telehealth: Payer: Self-pay | Admitting: Family Medicine

## 2017-03-19 NOTE — Telephone Encounter (Signed)
Copied from CRM #[id number]. Topic: Quick Communication - See Telephone Encounter >> Mar 19, 2017 10:08 AM Terisa Starraylor, Brittany L wrote: CRM for notification. See Telephone encounter for:  03/19/17. Patient was told to call the office when he needed his 90 day script refilled for ELIQUIS 5mg .

## 2017-03-20 NOTE — Telephone Encounter (Signed)
From my review of the chart it looks like pt has not seen you since 09/2016, and there is a note from the [facility] cancer center where he spoke with a nurse there regarding this medication. Should he be getting this medication filled there? Please advise.

## 2017-03-20 NOTE — Telephone Encounter (Signed)
Yes, please. He should be getting his Eliquis from his hematologist. Thank you

## 2017-03-21 NOTE — Telephone Encounter (Signed)
  Let's make sure he has a refill of his Eliquis.  M

## 2017-03-23 NOTE — Telephone Encounter (Signed)
[name] has reached out to this patient (see below), however to date he has not returned a call to the clinic. We will attempt to contact again. Judie Grieve- Kellye Mizner   Conversation: Advice Only  (Newest Message First)  Guadalupe MapleBlack, Anita C, RN      03/04/17 2:13 PM  Note    Called patient and LVM that I received a fax from [employer] regarding patient assistance for Eliquis, that will expire on 03-18-17.  We can resubmit application for continued assistance, however, there is a portion of the application that has to be completed by the patient.  Informed him that we will need documentation of annual income.  Also informed him that I will be on vacation next week, therefore, needs to be addressed this week.  Asked patient to return my call tomorrow in the [place], office.

## 2017-03-24 ENCOUNTER — Other Ambulatory Visit: Payer: Self-pay | Admitting: *Deleted

## 2017-03-24 MED ORDER — APIXABAN 5 MG PO TABS: 5.0000 mg | ORAL_TABLET | Freq: Two times a day (BID) | ORAL | 3 refills | Status: DC

## 2017-03-24 NOTE — Telephone Encounter (Signed)
Patient has called asking for a 90 refill of his Eliquis

## 2017-03-24 NOTE — Telephone Encounter (Signed)
Prescription sent

## 2017-03-24 NOTE — Telephone Encounter (Signed)
We have been trying to contact him regarding this prescription for quite sometime. I can send in the Rx, however it will not be covered under the previous patient financial assistance plan. It has expired. He has to fill out a new application. He has not returned calls to [facility] or [facility]. [facility] can get what we need to complete the updated application and get him his medications.

## 2017-03-24 NOTE — Telephone Encounter (Signed)
I returned call to patient who advises me that he currently has insurance through the school system and can get his prescription for $30 and does not want to take away form someone else who may need the assistance if he can afford it

## 2017-04-15 ENCOUNTER — Inpatient Hospital Stay: Payer: BC Managed Care – PPO | Attending: Hematology and Oncology

## 2017-04-15 DIAGNOSIS — Z7901 Long term (current) use of anticoagulants: Secondary | ICD-10-CM | POA: Diagnosis not present

## 2017-04-15 DIAGNOSIS — I82541 Chronic embolism and thrombosis of right tibial vein: Secondary | ICD-10-CM | POA: Insufficient documentation

## 2017-04-15 LAB — BASIC METABOLIC PANEL
Anion gap: 6 (ref 5–15)
BUN: 13 mg/dL (ref 6–20)
CO2: 27 mmol/L (ref 22–32)
Calcium: 9 mg/dL (ref 8.9–10.3)
Chloride: 105 mmol/L (ref 101–111)
Creatinine, Ser: 1.28 mg/dL — ABNORMAL HIGH (ref 0.61–1.24)
GFR calc Af Amer: >60 mL/min (ref >60)
GFR calc non Af Amer: 59 mL/min — ABNORMAL LOW (ref >60)
Glucose, Bld: 108 mg/dL — ABNORMAL HIGH (ref 65–99)
Potassium: 4.5 mmol/L (ref 3.5–5.1)
Sodium: 138 mmol/L (ref 135–145)

## 2017-04-15 LAB — CBC WITH DIFFERENTIAL/PLATELET
Basophils Absolute: 0 10*3/uL (ref 0–0.1)
Basophils Relative: 1 %
Eosinophils Absolute: 0.2 10*3/uL (ref 0–0.7)
Eosinophils Relative: 3 %
HCT: 42 % (ref 40.0–52.0)
Hemoglobin: 14 g/dL (ref 13.0–18.0)
Lymphocytes Relative: 22 %
Lymphs Abs: 1.1 10*3/uL (ref 1.0–3.6)
MCH: 29 pg (ref 26.0–34.0)
MCHC: 33.3 g/dL (ref 32.0–36.0)
MCV: 87.2 fL (ref 80.0–100.0)
Monocytes Absolute: 0.3 10*3/uL (ref 0.2–1.0)
Monocytes Relative: 7 %
Neutro Abs: 3.4 10*3/uL (ref 1.4–6.5)
Neutrophils Relative %: 67 %
Platelets: 197 10*3/uL (ref 150–440)
RBC: 4.82 MIL/uL (ref 4.40–5.90)
RDW: 13.7 % (ref 11.5–14.5)
WBC: 5 10*3/uL (ref 3.8–10.6)

## 2017-04-17 ENCOUNTER — Other Ambulatory Visit: Payer: BC Managed Care – PPO

## 2017-06-16 ENCOUNTER — Other Ambulatory Visit: Payer: Self-pay | Admitting: Family Medicine

## 2017-06-16 DIAGNOSIS — Z1211 Encounter for screening for malignant neoplasm of colon: Secondary | ICD-10-CM

## 2017-06-16 NOTE — Progress Notes (Unsigned)
cologuard ordered.

## 2017-06-17 ENCOUNTER — Other Ambulatory Visit: Payer: Self-pay

## 2017-06-17 DIAGNOSIS — Z1211 Encounter for screening for malignant neoplasm of colon: Secondary | ICD-10-CM

## 2017-07-08 LAB — COLOGUARD: Cologuard: NEGATIVE

## 2017-07-18 NOTE — Progress Notes (Deleted)
[facility]-  Cancer Center  Clinic day:  07/18/2017  Chief Complaint: Nathaniel [name] is a 62 y.o. male with recurrent right lower extremity DVT who is seen for 6 month assessment on Eliquis.   HPI: The patient was last seen in the hematology clinic on  01/16/2017.  At that time, he was doing well on Eliquis.  Labs were normal.  During the interim,   Past Medical History:  Diagnosis Date  . CKD (chronic kidney disease) stage 2, GFR 60-89 ml/min   . Gout   . History of DVT (deep vein thrombosis)    DVT right lower extremity in 2003 on coumadin for 3 months, INR levels subtherapeutic, started having slurred words and memory issues, extensive TIA/CVA work up done, no residual deficits. Switched to asa plus plavix ever since and has done well thus far.  Marland Kitchen. History of elevated PSA    Has seen [place]rolog[place] in [place]: Prostate Biopsy Benign (Specialist wanted to do biopsies q6 months but patient did not think this was necessary and would rather monitor with annual PSA's)  . History of TIA (transient ischemic attack)   . Pre-diabetes     Past Surgical History:  Procedure Laterality Date  . BIOPSY PROSTATE  ?2012   neg  . [place]    Family History  Problem Relation Age of Onset  . Kidney disease Mother        Dialysis  . Cancer Mother        Breast  . Diabetes Father   . Cancer Father [id number]       Prostate  . Gout Sister     Social History:  reports that  has never smoked. he has never used smokeless tobacco. He reports that he drinks alcohol. He reports that he does not use drugs.  He smoked for 4 months in 1991.  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  He was an Environmental manageronline teacher.  He works from his house.  He has 2 daughters (age 63 and [id number]) who were both in the Olympics (track and volleyball).  His daughter who plays volleyball has moved to [place]; she plans to play for [place]  in the 2020 Olympics.  The patient is alone today.  Allergies: No Known Allergies  Current Medications: Current Outpatient Medications  Medication Sig Dispense Refill  . apixaban (ELIQUIS) 5 MG TABS tablet Take 1 tablet (5 mg total) by mouth 2 (two) times daily. 180 tablet 3  . aspirin 81 MG chewable tablet Chew by mouth.    . colchicine 0.6 MG tablet Two pills by mouth now, then one pill one hour later (Patient taking differently: as needed. Two pills by mouth now, then one pill one hour later) 6 tablet 0   No current facility-administered medications for this visit.     Review of Systems:  GENERAL:  Feels "fantastic".  Active.  No fevers or sweats.  Weight up 10 pounds in 6 months. PERFORMANCE STATUS (ECOG):  0 HEENT:  No visual changes, runny nose, sore throat, mouth sores or tenderness. Lungs: No shortness of breath or cough.  No hemoptysis. Cardiac:  No chest pain, palpitations, orthopnea, or PND. GI:  No nausea, vomiting, diarrhea, constipation, melena or hematochezia.  Last colonoscopy 11 years ago. GU:  No urgency, frequency, dysuria, or hematuria.   Musculoskeletal:  No back pain.  No joint pain.  No muscle tenderness. Extremities:  No pain or swelling. Skin:  No rashes or skin changes. Neuro:  No headache, numbness or weakness, balance or coordination issues. Endocrine:  No diabetes, thyroid issues, hot flashes or night sweats. Psych:  No mood changes, depression or anxiety. Pain:  No focal pain. Review of systems:  All other systems reviewed and found to be negative.  Physical Exam: There were no vitals taken for this visit. GENERAL:  Well developed, well nourished, gentleman sitting comfortably in the exam room in no acute distress. MENTAL STATUS:  Alert and oriented to person, place and time. HEAD:  Alopecia.  Goatee.  Normocephalic, atraumatic, face symmetric, no Cushingoid features. EYES:  Glasses.  Brown eyes.  Pupils equal round and reactive to light and  accomodation.  No conjunctivitis or scleral icterus. ENT:  Oropharynx clear without lesion.  Tongue normal. Mucous membranes moist.  RESPIRATORY:  Clear to auscultation without rales, wheezes or rhonchi. CARDIOVASCULAR:  Regular rate and rhythm without murmur, rub or gallop. ABDOMEN:  Soft, non-tender, with active bowel sounds, and no hepatosplenomegaly.  No masses. SKIN:  No rashes, ulcers or lesions. EXTREMITIES: No edema, no skin discoloration or tenderness.  No palpable cords. LYMPH NODES: No palpable cervical, supraclavicular, axillary or inguinal adenopathy  NEUROLOGICAL: Unremarkable. PSYCH:  Appropriate.   No visits with results within 3 Day(s) from this visit.  Latest known visit with results is:  Abstract on 07/09/2017  Component Date Value Ref Range Status  . Cologuard 07/01/2017 Negative   Final    Assessment:  Nathaniel [name] is a 62 y.o. 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  Initial DVT was in his right calf approximately 13 years ago while in [place], [place]. He denied any precipitating events. He is extremely active. He was placed on Lovenox and converted to Coumadin.  He is unclear how long he was on Coumadin.  He then had an episode of disorientation years ago. He was diagnosed with a TIA. Coumadin was discontinued. He was placed on aspirin and Plavix.  He is off Plavix.  Right lower extremity duplex on 02/21/2016 revealed occlusive thrombus within one of the posterior tibial veins. There was apparent thrombus within additional veins at the popliteal fossa possibly reflecting gastrocnemius veins.  He is on aspirin and Eliquis.   Hypercoagulable work-up on 03/11/2016 revealed the following normal studies:  CBC with diff, factor V Leiden, prothrombin gene mutation , lupus anticoagulant panel, anticardiolipin antibodies, and beta-2-glycoprotein antibodies.  Protein C, protein S, and ATIII (activity and antigen) were normal on 07/14/2016.  PSA was 1.7 on  08/27/2015.  His last colonoscopy was 11 years ago (late).  Cologuard was negative on 07/01/2017.  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  He has no family history of thrombosis.  His father had prostate cancer.  Symptomatically, he feels well.  Exam is unremarkable. CBC and CMP are normal.   Plan: 1.  Labs today: CBC with diff, CMP. 2.  Continue Eliquis.   3.  RTC in 3 months for labs (CBC, BMP). 4.  RTC in 6 months for MD assessment and labs (CBC with diff, CMP).   Rosey Bath, MD  07/18/2017, 10:58 PM   I saw and evaluated the patient, participating in the key portions of the service and reviewing pertinent diagnostic studies and records.  I reviewed the nurse practitioner's note and agree with the findings and the plan.  The assessment and plan were discussed  with the patient.  Additional diagnostic studies of *** are needed to clarify *** and would change the clinical management.  A few ***multiple questions were asked by the patient and answered.   Nelva Nay, MD 07/18/2017,10:58 PM

## 2017-07-20 ENCOUNTER — Inpatient Hospital Stay: Payer: BC Managed Care – PPO | Attending: Hematology and Oncology

## 2017-07-20 ENCOUNTER — Inpatient Hospital Stay: Payer: BC Managed Care – PPO | Admitting: Hematology and Oncology

## 2017-07-31 ENCOUNTER — Encounter: Payer: Self-pay | Admitting: Hematology and Oncology

## 2017-07-31 ENCOUNTER — Inpatient Hospital Stay: Payer: BC Managed Care – PPO | Attending: Hematology and Oncology

## 2017-07-31 ENCOUNTER — Inpatient Hospital Stay (HOSPITAL_BASED_OUTPATIENT_CLINIC_OR_DEPARTMENT_OTHER): Payer: BC Managed Care – PPO | Admitting: Hematology and Oncology

## 2017-07-31 VITALS — BP 130/80 | HR 65 | Temp 96.6°F | Resp 20 | Wt 224.6 lb

## 2017-07-31 DIAGNOSIS — Z7901 Long term (current) use of anticoagulants: Secondary | ICD-10-CM

## 2017-07-31 DIAGNOSIS — Z8673 Personal history of transient ischemic attack (TIA), and cerebral infarction without residual deficits: Secondary | ICD-10-CM | POA: Diagnosis not present

## 2017-07-31 DIAGNOSIS — I82409 Acute embolism and thrombosis of unspecified deep veins of unspecified lower extremity: Secondary | ICD-10-CM

## 2017-07-31 DIAGNOSIS — I82541 Chronic embolism and thrombosis of right tibial vein: Secondary | ICD-10-CM

## 2017-07-31 LAB — COMPREHENSIVE METABOLIC PANEL
ALT: 24 U/L (ref 17–63)
AST: 30 U/L (ref 15–41)
Albumin: 4 g/dL (ref 3.5–5.0)
Alkaline Phosphatase: 62 U/L (ref 38–126)
Anion gap: 10 (ref 5–15)
BUN: 17 mg/dL (ref 6–20)
CO2: 23 mmol/L (ref 22–32)
Calcium: 8.9 mg/dL (ref 8.9–10.3)
Chloride: 104 mmol/L (ref 101–111)
Creatinine, Ser: 1.37 mg/dL — ABNORMAL HIGH (ref 0.61–1.24)
GFR calc Af Amer: >60 mL/min (ref >60)
GFR calc non Af Amer: 54 mL/min — ABNORMAL LOW (ref >60)
Glucose, Bld: 95 mg/dL (ref 65–99)
Potassium: 4.2 mmol/L (ref 3.5–5.1)
Sodium: 137 mmol/L (ref 135–145)
Total Bilirubin: 0.7 mg/dL (ref 0.3–1.2)
Total Protein: 7.1 g/dL (ref 6.5–8.1)

## 2017-07-31 LAB — CBC WITH DIFFERENTIAL/PLATELET
Basophils Absolute: 0 10*3/uL (ref 0–0.1)
Basophils Relative: 1 %
Eosinophils Absolute: 0.1 10*3/uL (ref 0–0.7)
Eosinophils Relative: 3 %
HCT: 40.1 % (ref 40.0–52.0)
Hemoglobin: 13.6 g/dL (ref 13.0–18.0)
Lymphocytes Relative: 38 %
Lymphs Abs: 1.4 10*3/uL (ref 1.0–3.6)
MCH: 29.3 pg (ref 26.0–34.0)
MCHC: 33.9 g/dL (ref 32.0–36.0)
MCV: 86.6 fL (ref 80.0–100.0)
Monocytes Absolute: 0.3 10*3/uL (ref 0.2–1.0)
Monocytes Relative: 7 %
Neutro Abs: 1.9 10*3/uL (ref 1.4–6.5)
Neutrophils Relative %: 51 %
Platelets: 241 10*3/uL (ref 150–440)
RBC: 4.63 MIL/uL (ref 4.40–5.90)
RDW: 13.5 % (ref 11.5–14.5)
WBC: 3.7 10*3/uL — ABNORMAL LOW (ref 3.8–10.6)

## 2017-07-31 NOTE — Progress Notes (Signed)
[facility]-  Cancer Center  Clinic day:  07/31/2017  Chief Complaint: Nathaniel [name] is a 61 y.o. male with recurrent right lower extremity DVT who is seen for 6 month assessment on Eliquis.   HPI: The patient was last seen in the hematology clinic on  01/16/2017.  At that time, he was doing well on Eliquis.  Labs were normal.  During the interim, patient has been fatigued. He is working more. Patient denies bleeding; no hematochezia, melena, or gross hematuria. He continues on anticoagulation using apixaban. He has not experienced any areas of unexplained bruising.   Patient is eating well. His weight is down 8 pounds. He states, "I went through the New Year's fasting, so I will gain it back throughout the year".   Patient denies pain in the clinic today.     Past Medical History:  Diagnosis Date  . CKD (chronic kidney disease) stage 2, GFR 60-89 ml/min   . Gout   . History of DVT (deep vein thrombosis)    DVT right lower extremity in 2003 on coumadin for 3 months, INR levels subtherapeutic, started having slurred words and memory issues, extensive TIA/CVA work up done, no residual deficits. Switched to asa plus plavix ever since and has done well thus far.  Marland Kitchen History of elevated PSA    Has seen Urologist in [place]: Prostate Biopsy Benign (Specialist wanted to do biopsies q6 months but patient did not think this was necessary and would rather monitor with annual PSA's)  . History of TIA (transient ischemic attack)   . Pre-diabetes     Past Surgical History:  Procedure Laterality Date  . BIOPSY PROSTATE  ?2012   neg  . COLONOSCOPY  2007   in [place]    Family History  Problem Relation Age of Onset  . Kidney disease Mother        Dialysis  . Cancer Mother        Breast  . Diabetes Father   . Cancer Father 72       Prostate  . Gout Sister     Social History:  reports that  has never smoked. he has never used smokeless tobacco. He reports that he drinks  alcohol. He reports that he does not use drugs.  He smoked for 4 months in 1991.  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  He has 2 daughters (age 59 and 59) who were both in the Olympics (track and volleyball).  His daughter who plays volleyball has moved to [place]; she plans to play for [place] in the 2020 Olympics.  The patient is alone today.  Allergies: No Known Allergies  Current Medications: Current Outpatient Medications  Medication Sig Dispense Refill  . apixaban (ELIQUIS) 5 MG TABS tablet Take 1 tablet (5 mg total) by mouth 2 (two) times daily. 180 tablet 3  . aspirin 81 MG chewable tablet Chew by mouth.    . colchicine 0.6 MG tablet Two pills by mouth now, then one pill one hour later (Patient not taking: Reported on 07/31/2017) 6 tablet 0   No current facility-administered medications for this visit.     Review of Systems:  GENERAL:  Feels "fine".  "A little tired" due to work schedule. No fevers or sweats.  Weight down 8 pounds after fasting. PERFORMANCE STATUS (ECOG):  0 HEENT:  Intermittent light sensation in right eye (seeing eye doctor).  No runny nose, sore throat, mouth sores or tenderness. Lungs: No shortness of breath or cough.  No hemoptysis. Cardiac:  No chest pain, palpitations, orthopnea, or PND. GI:  No nausea, vomiting, diarrhea, constipation, melena or hematochezia.  Last colonoscopy 11 years ago. GU:  No urgency, frequency, dysuria, or hematuria.   Musculoskeletal:  No back pain.  No joint pain.  No muscle tenderness. Extremities:  No pain or swelling. Skin:  No rashes or skin changes. Neuro:  No headache, numbness or weakness, balance or coordination issues. Endocrine:  No diabetes, thyroid issues, hot flashes or night sweats. Psych:  No mood changes, depression or anxiety. Pain:  No focal pain. Review of systems:  All other  systems reviewed and found to be negative.  Physical Exam: Blood pressure 130/80, pulse 65, temperature (!) 96.6 F (35.9 C), temperature source Tympanic, resp. rate 20, weight 224 lb 9 oz (101.9 kg). GENERAL:  Well developed, well nourished, gentleman sitting comfortably in the exam room in no acute distress. MENTAL STATUS:  Alert and oriented to person, place and time. HEAD:  Alopecia.  Goatee.  Normocephalic, atraumatic, face symmetric, no Cushingoid features. EYES:  Glasses.  Brown eyes.  Pupils equal round and reactive to light and accomodation.  No conjunctivitis or scleral icterus. ENT:  Oropharynx clear without lesion.  Tongue normal. Mucous membranes moist.  RESPIRATORY:  Clear to auscultation without rales, wheezes or rhonchi. CARDIOVASCULAR:  Regular rate and rhythm without murmur, rub or gallop. ABDOMEN:  Soft, non-tender, with active bowel sounds, and no hepatosplenomegaly.  No masses. SKIN:  No rashes, ulcers or lesions. EXTREMITIES: Right lower extremity > left lower extremity (stable).  No edema, no skin discoloration or tenderness.  No palpable cords. LYMPH NODES: No palpable cervical, supraclavicular, axillary or inguinal adenopathy  NEUROLOGICAL: Unremarkable. PSYCH:  Appropriate.   Appointment on 07/31/2017  Component Date Value Ref Range Status  . WBC 07/31/2017 3.7* 3.8 - 10.6 K/uL Final  . RBC 07/31/2017 4.63  4.40 - 5.90 MIL/uL Final  . Hemoglobin 07/31/2017 13.6  13.0 - 18.0 g/dL Final  . HCT 16/10/960403/12/2017 40.1  40.0 - 52.0 % Final  . MCV 07/31/2017 86.6  80.0 - 100.0 fL Final  . MCH 07/31/2017 29.3  26.0 - 34.0 pg Final  . MCHC 07/31/2017 33.9  32.0 - 36.0 g/dL Final  . RDW 54/09/811903/12/2017 13.5  11.5 - 14.5 % Final  . Platelets 07/31/2017 241  150 - 440 K/uL Final  . Neutrophils Relative % 07/31/2017 51  % Final  . Neutro Abs 07/31/2017 1.9  1.4 - 6.5 K/uL Final  . Lymphocytes Relative 07/31/2017 38  % Final  . Lymphs Abs 07/31/2017 1.4  1.0 - 3.6 K/uL Final  .  Monocytes Relative 07/31/2017 7  % Final  . Monocytes Absolute 07/31/2017 0.3  0.2 - 1.0 K/uL Final  . Eosinophils Relative 07/31/2017 3  % Final  . Eosinophils Absolute 07/31/2017 0.1  0 - 0.7 K/uL Final  . Basophils Relative 07/31/2017 1  % Final  . Basophils Absolute 07/31/2017 0.0  0 - 0.1 K/uL Final   Performed at [facility], [address] [place], [place] [id number]    Assessment:  Milus GlazierRicky Viscuso is a 62 y.o. 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  Initial DVT was in his right calf approximately 13 years ago while in [place]. He denied any precipitating events. He is extremely active. He was placed  on Lovenox and converted to Coumadin.  He is unclear how long he was on Coumadin.  He then had an episode of disorientation years ago. He was diagnosed with a TIA. Coumadin was discontinued. He was placed on aspirin and Plavix.  He is off Plavix.  Right lower extremity duplex on 02/21/2016 revealed occlusive thrombus within one of the posterior tibial veins. There was apparent thrombus within additional veins at the popliteal fossa possibly reflecting gastrocnemius veins.  He is on aspirin and Eliquis.   Hypercoagulable work-up on 03/11/2016 revealed the following normal studies:  CBC with diff, factor V Leiden, prothrombin gene mutation , lupus anticoagulant panel, anticardiolipin antibodies, and beta-2-glycoprotein antibodies.  Protein C, protein S, and ATIII (activity and antigen) were normal on 07/14/2016.  PSA was 1.7 on 08/27/2015.  His last colonoscopy was 11 years ago (late).  Cologuard was negative on 07/01/2017.  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  He has no family history of thrombosis.  His father had prostate cancer.  Symptomatically, he feels well.  Exam is unremarkable. CBC and CMP are normal.   Plan: 1.  Labs today: CBC with diff, CMP. 2.  Continue Eliquis as previously prescribed.  3.  RTC in 3 months for labs (CBC,  BMP). 4.  RTC in 6 months for MD assessment and labs (CBC with diff, CMP).   Quentin Mulling, NP  07/31/2017, 3:44 PM   I saw and evaluated the patient, participating in the key portions of the service and reviewing pertinent diagnostic studies and records.  I reviewed the nurse practitioner's note and agree with the findings and the plan.  The assessment and plan were discussed with the patient.  A few questions were asked by the patient and answered.   Nelva Nay, MD 07/31/2017,3:44 PM

## 2017-07-31 NOTE — Progress Notes (Signed)
Patient offers no complaints today. 

## 2017-10-30 ENCOUNTER — Inpatient Hospital Stay: Payer: BC Managed Care – PPO | Attending: Hematology and Oncology

## 2018-02-01 ENCOUNTER — Inpatient Hospital Stay: Payer: Self-pay | Admitting: Hematology and Oncology

## 2018-02-01 ENCOUNTER — Inpatient Hospital Stay: Payer: Self-pay | Attending: Hematology and Oncology

## 2018-02-01 NOTE — Progress Notes (Deleted)
[facility] day:  02/01/2018  Chief Complaint: Nathaniel [name] is a 62 y.o. male with recurrent right lower extremity DVT who is seen for 6 month assessment on Eliquis.   HPI: The patient was last seen in the hematology clinic on 07/31/2017.  At that time, he felt well.  Exam was unremarkable. CBC and CMP were normal.  He continued Eliquis.  During the interim,   Past Medical History:  Diagnosis Date  . CKD (chronic kidney disease) stage 2, GFR 60-89 ml/min   . Gout   . History of DVT (deep vein thrombosis)    DVT right lower extremity in 2003 on coumadin for 3 months, INR levels subtherapeutic, started having slurred words and memory issues, extensive TIA/CVA work up done, no residual deficits. Switched to asa plus plavix ever since and has done well thus far.  Marland Kitchen History of elevated PSA    Has seen Urologist in [place]: Prostate Biopsy Benign (Specialist wanted to do biopsies q6 months but patient did not think this was necessary and would rather monitor with annual PSA's)  . History of TIA (transient ischemic attack)   . Pre-diabetes     Past Surgical History:  Procedure Laterality Date  . BIOPSY PROSTATE  ?2012   neg  . COLONOSCOPY  2007   in [place]    Family History  Problem Relation Age of Onset  . Kidney disease Mother        Dialysis  . Cancer Mother        Breast  . Diabetes Father   . Cancer Father 79       Prostate  . Gout Sister     Social History:  reports that he has never smoked. He has never used smokeless tobacco. He reports that he drinks alcohol. He reports that he does not use drugs.  He smoked for 4 months in 1991.  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  He has 2 daughters (age 95 and 12) who were both in the Olympics (track and volleyball).  His daughter who plays volleyball has moved to  [place]; she plans to play for [place] in the 2020 Olympics.  The patient is alone today.  Allergies: No Known Allergies  Current Medications: Current Outpatient Medications  Medication Sig Dispense Refill  . apixaban (ELIQUIS) 5 MG TABS tablet Take 1 tablet (5 mg total) by mouth 2 (two) times daily. 180 tablet 3  . aspirin 81 MG chewable tablet Chew by mouth.    . colchicine 0.6 MG tablet Two pills by mouth now, then one pill one hour later (Patient not taking: Reported on 07/31/2017) 6 tablet 0   No current facility-administered medications for this visit.     Review of Systems:  GENERAL:  Feels "fine".  "A little tired" due to work schedule. No fevers or sweats.  Weight down 8 pounds after fasting. PERFORMANCE STATUS (ECOG):  0 HEENT:  Intermittent light sensation in right eye (seeing eye doctor).  No runny nose, sore throat, mouth sores or tenderness. Lungs: No shortness of breath or cough.  No hemoptysis. Cardiac:  No chest pain, palpitations, orthopnea, or PND. GI:  No nausea, vomiting, diarrhea, constipation, melena or hematochezia.  Last colonoscopy 11 years ago. GU:  No urgency, frequency, dysuria, or hematuria.   Musculoskeletal:  No  back pain.  No joint pain.  No muscle tenderness. Extremities:  No pain or swelling. Skin:  No rashes or skin changes. Neuro:  No headache, numbness or weakness, balance or coordination issues. Endocrine:  No diabetes, thyroid issues, hot flashes or night sweats. Psych:  No mood changes, depression or anxiety. Pain:  No focal pain. Review of systems:  All other systems reviewed and found to be negative.  Physical Exam: There were no vitals taken for this visit. GENERAL:  Well developed, well nourished, gentleman sitting comfortably in the exam room in no acute distress. MENTAL STATUS:  Alert and oriented to person, place and time. HEAD:  Alopecia.  Goatee.  Normocephalic, atraumatic, face symmetric, no Cushingoid features. EYES:  Glasses.  Brown  eyes.  Pupils equal round and reactive to light and accomodation.  No conjunctivitis or scleral icterus. ENT:  Oropharynx clear without lesion.  Tongue normal. Mucous membranes moist.  RESPIRATORY:  Clear to auscultation without rales, wheezes or rhonchi. CARDIOVASCULAR:  Regular rate and rhythm without murmur, rub or gallop. ABDOMEN:  Soft, non-tender, with active bowel sounds, and no hepatosplenomegaly.  No masses. SKIN:  No rashes, ulcers or lesions. EXTREMITIES: Right lower extremity > left lower extremity (stable).  No edema, no skin discoloration or tenderness.  No palpable cords. LYMPH NODES: No palpable cervical, supraclavicular, axillary or inguinal adenopathy  NEUROLOGICAL: Unremarkable. PSYCH:  Appropriate.   No visits with results within 3 Day(s) from this visit.  Latest known visit with results is:  Appointment on 07/31/2017  Component Date Value Ref Range Status  . Sodium 07/31/2017 137  135 - 145 mmol/L Final  . Potassium 07/31/2017 4.2  3.5 - 5.1 mmol/L Final  . Chloride 07/31/2017 104  101 - 111 mmol/L Final  . CO2 07/31/2017 23  22 - 32 mmol/L Final  . Glucose, Bld 07/31/2017 95  65 - 99 mg/dL Final  . BUN 07/31/2017 17  6 - 20 mg/dL Final  . Creatinine, Ser 07/31/2017 1.37* 0.61 - 1.24 mg/dL Final  . Calcium 07/31/2017 8.9  8.9 - 10.3 mg/dL Final  . Total Protein 07/31/2017 7.1  6.5 - 8.1 g/dL Final  . Albumin 07/31/2017 4.0  3.5 - 5.0 g/dL Final  . AST 07/31/2017 30  15 - 41 U/L Final  . ALT 07/31/2017 24  17 - 63 U/L Final  . Alkaline Phosphatase 07/31/2017 62  38 - 126 U/L Final  . Total Bilirubin 07/31/2017 0.7  0.3 - 1.2 mg/dL Final  . GFR calc non Af Amer 07/31/2017 54* >60 mL/min Final  . GFR calc Af Amer 07/31/2017 >60  >60 mL/min Final   Comment: (NOTE) The eGFR has been calculated using the CKD EPI equation. This calculation has not been validated in all clinical situations. eGFR's persistently <60 mL/min signify possible Chronic Kidney Disease.   Georgiann Hahn gap 07/31/2017 10  5 - 15 Final   Performed at [facility], [address] [place], [place] [address]  . WBC 07/31/2017 3.7* 3.8 - 10.6 K/uL Final  . RBC 07/31/2017 4.63  4.40 - 5.90 MIL/uL Final  . Hemoglobin 07/31/2017 13.6  13.0 - 18.0 g/dL Final  . HCT 07/31/2017 40.1  40.0 - 52.0 % Final  . MCV 07/31/2017 86.6  80.0 - 100.0 fL Final  . MCH 07/31/2017 29.3  26.0 - 34.0 pg Final  . MCHC 07/31/2017 33.9  32.0 - 36.0 g/dL Final  . RDW 07/31/2017 13.5  11.5 - 14.5 % Final  . Platelets  07/31/2017 241  150 - 440 K/uL Final  . Neutrophils Relative % 07/31/2017 51  % Final  . Neutro Abs 07/31/2017 1.9  1.4 - 6.5 K/uL Final  . Lymphocytes Relative 07/31/2017 38  % Final  . Lymphs Abs 07/31/2017 1.4  1.0 - 3.6 K/uL Final  . Monocytes Relative 07/31/2017 7  % Final  . Monocytes Absolute 07/31/2017 0.3  0.2 - 1.0 K/uL Final  . Eosinophils Relative 07/31/2017 3  % Final  . Eosinophils Absolute 07/31/2017 0.1  0 - 0.7 K/uL Final  . Basophils Relative 07/31/2017 1  % Final  . Basophils Absolute 07/31/2017 0.0  0 - 0.1 K/uL Final   Performed at [facility], [address] [place], [place] [address]    Assessment:  Arrow Tomko is a 62 y.o. 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  Initial DVT was in his right calf approximately 13 years ago while in [place], [place]. He denied any precipitating events. He is extremely active. He was placed on Lovenox and converted to Coumadin.  He is unclear how long he was on Coumadin.  He then had an episode of disorientation years ago. He was diagnosed with a TIA. Coumadin was discontinued. He was placed on aspirin and Plavix.  He is off Plavix.  Right lower extremity duplex on 02/21/2016 revealed occlusive thrombus within one of the posterior tibial veins. There was apparent thrombus within additional veins at the popliteal fossa possibly reflecting gastrocnemius veins.  He is on aspirin and Eliquis.   Hypercoagulable  work-up on 03/11/2016 revealed the following normal studies:  CBC with diff, factor V Leiden, prothrombin gene mutation , lupus anticoagulant panel, anticardiolipin antibodies, and beta-2-glycoprotein antibodies.  Protein C, protein S, and ATIII (activity and antigen) were normal on 07/14/2016.  PSA was 1.7 on 08/27/2015.  His last colonoscopy was 11 years ago (late).  Cologuard was negative on 07/01/2017.  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  He has no family history of thrombosis.  His father had prostate cancer.  Symptomatically,  he feels well.  Exam is unremarkable. CBC and CMP are normal.   Plan: 1.  Labs today:  CBC with diff, CMP. 2.  Recurrent right lower extremity DVT:   2.  Continue Eliquis as previously prescribed.  3.  RTC in 3 months for labs (CBC, BMP). 4.  RTC in 6 months for MD assessment and labs (CBC with diff, CMP).   Lequita Asal, MD  02/01/2018, 5:21 AM   I saw and evaluated the patient, participating in the key portions of the service and reviewing pertinent diagnostic studies and records.  I reviewed the nurse practitioner's note and agree with the findings and the plan.  The assessment and plan were discussed with the patient.  A few questions were asked by the patient and answered.   Nolon Stalls, MD 02/01/2018,5:21 AM

## 2018-02-23 ENCOUNTER — Other Ambulatory Visit: Payer: Self-pay | Admitting: Urgent Care

## 2018-03-11 ENCOUNTER — Encounter: Payer: Self-pay | Admitting: Hematology and Oncology

## 2018-03-22 ENCOUNTER — Encounter: Payer: Self-pay | Admitting: Hematology and Oncology

## 2018-03-22 ENCOUNTER — Inpatient Hospital Stay: Payer: BC Managed Care – PPO | Attending: Hematology and Oncology

## 2018-03-22 ENCOUNTER — Inpatient Hospital Stay (HOSPITAL_BASED_OUTPATIENT_CLINIC_OR_DEPARTMENT_OTHER): Payer: BC Managed Care – PPO | Admitting: Hematology and Oncology

## 2018-03-22 VITALS — BP 109/78 | HR 69 | Temp 97.9°F | Resp 18 | Ht 76.0 in | Wt 224.5 lb

## 2018-03-22 DIAGNOSIS — I82541 Chronic embolism and thrombosis of right tibial vein: Secondary | ICD-10-CM

## 2018-03-22 DIAGNOSIS — N289 Disorder of kidney and ureter, unspecified: Secondary | ICD-10-CM

## 2018-03-22 DIAGNOSIS — Z8673 Personal history of transient ischemic attack (TIA), and cerebral infarction without residual deficits: Secondary | ICD-10-CM | POA: Diagnosis not present

## 2018-03-22 DIAGNOSIS — Z7901 Long term (current) use of anticoagulants: Secondary | ICD-10-CM | POA: Insufficient documentation

## 2018-03-22 DIAGNOSIS — I82451 Acute embolism and thrombosis of right peroneal vein: Secondary | ICD-10-CM | POA: Insufficient documentation

## 2018-03-22 DIAGNOSIS — I82409 Acute embolism and thrombosis of unspecified deep veins of unspecified lower extremity: Secondary | ICD-10-CM

## 2018-03-22 LAB — COMPREHENSIVE METABOLIC PANEL
ALT: 20 U/L (ref 0–44)
AST: 26 U/L (ref 15–41)
Albumin: 4.1 g/dL (ref 3.5–5.0)
Alkaline Phosphatase: 59 U/L (ref 38–126)
Anion gap: 6 (ref 5–15)
BUN: 17 mg/dL (ref 8–23)
CO2: 25 mmol/L (ref 22–32)
Calcium: 9.2 mg/dL (ref 8.9–10.3)
Chloride: 108 mmol/L (ref 98–111)
Creatinine, Ser: 1.48 mg/dL — ABNORMAL HIGH (ref 0.61–1.24)
GFR calc Af Amer: 57 mL/min — ABNORMAL LOW (ref >60)
GFR calc non Af Amer: 49 mL/min — ABNORMAL LOW (ref >60)
Glucose, Bld: 104 mg/dL — ABNORMAL HIGH (ref 70–99)
Potassium: 4.3 mmol/L (ref 3.5–5.1)
Sodium: 139 mmol/L (ref 135–145)
Total Bilirubin: 0.6 mg/dL (ref 0.3–1.2)
Total Protein: 7.1 g/dL (ref 6.5–8.1)

## 2018-03-22 LAB — CBC WITH DIFFERENTIAL/PLATELET
Abs Immature Granulocytes: 0 10*3/uL (ref 0.00–0.07)
Basophils Absolute: 0 10*3/uL (ref 0.0–0.1)
Basophils Relative: 1 %
Eosinophils Absolute: 0.1 10*3/uL (ref 0.0–0.5)
Eosinophils Relative: 3 %
HCT: 40.7 % (ref 39.0–52.0)
Hemoglobin: 13.4 g/dL (ref 13.0–17.0)
Immature Granulocytes: 0 %
Lymphocytes Relative: 38 %
Lymphs Abs: 1.3 10*3/uL (ref 0.7–4.0)
MCH: 28.7 pg (ref 26.0–34.0)
MCHC: 32.9 g/dL (ref 30.0–36.0)
MCV: 87.2 fL (ref 80.0–100.0)
Monocytes Absolute: 0.3 10*3/uL (ref 0.1–1.0)
Monocytes Relative: 8 %
Neutro Abs: 1.8 10*3/uL (ref 1.7–7.7)
Neutrophils Relative %: 50 %
Platelets: 196 10*3/uL (ref 150–400)
RBC: 4.67 MIL/uL (ref 4.22–5.81)
RDW: 12.6 % (ref 11.5–15.5)
WBC: 3.5 10*3/uL — ABNORMAL LOW (ref 4.0–10.5)
nRBC: 0 % (ref 0.0–0.2)

## 2018-03-22 MED ORDER — APIXABAN 5 MG PO TABS: 5.0000 mg | ORAL_TABLET | Freq: Two times a day (BID) | ORAL | 0 refills | Status: DC

## 2018-03-22 NOTE — Progress Notes (Signed)
[facility] day:  03/22/2018  Chief Complaint: Nathaniel [name] is a 62 y.o. male with recurrent right lower extremity DVT who is seen for 7 month assessment on Eliquis.   HPI: The patient was last seen in the hematology clinic on 07/31/2017.  At that time, he felt well.  Exam was unremarkable. CBC and CMP  were normal.   During the interim, patient has been feeling well.  He denies any acute complaints.  Previously missed appointments were related to insurance issues.  He denies any changes to his medical history or medications since his last visit.  Patient denies any claudication pain or swelling in his lower extremities.  He denies any shortness of breath or chest pain episodes.  He continues on his chronic anticoagulation (apixaban) as previously prescribed.  He denies any bruising or bleeding. Patient denies that he has experienced any B symptoms. He denies any interval infections.   Patient continues to describe an intermittent "light" sensation in his RIGHT eye.  He denies changes to his vision.  Patient previously encouraged to follow-up with his eye doctor, however due to insurance issues he has not done so yet.  Insurance will take effect in January 2020, at which time patient plans to see his ophthalmologist for further evaluation.  Patient advises that he maintains an adequate appetite. He is eating well. Weight today is 224 lb 8 oz (101.8 kg), which compared to his last visit to the clinic, represents a stable weight.  Patient is using beet root powder daily for "energy and circulation".  Patient denies pain in the clinic today.   Past Medical History:  Diagnosis Date  . CKD (chronic kidney disease) stage 2, GFR 60-89 ml/min   . Gout   . History of DVT (deep vein thrombosis)    DVT right lower extremity in 2003 on coumadin for 3 months, INR levels subtherapeutic, started having slurred words and memory issues, extensive TIA/CVA work up  done, no residual deficits. Switched to asa plus plavix ever since and has done well thus far.  Marland Kitchen History of elevated PSA    Has seen Urologist in [place]: Prostate Biopsy Benign (Specialist wanted to do biopsies q6 months but patient did not think this was necessary and would rather monitor with annual PSA's)  . History of TIA (transient ischemic attack)   . Pre-diabetes     Past Surgical History:  Procedure Laterality Date  . BIOPSY PROSTATE  ?2012   neg  . COLONOSCOPY  2007   in [place]    Family History  Problem Relation Age of Onset  . Kidney disease Mother        Dialysis  . Cancer Mother        Breast  . Diabetes Father   . Cancer Father 62       Prostate  . Gout Sister     Social History:  reports that he has never smoked. He has never used smokeless tobacco. He reports that he drinks alcohol. He reports that he does not use drugs.  He smoked for 4 months in 1991.  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  He has 2 daughters (age 61 and 52) who were both in the Olympics (track and volleyball).  His daughter who plays volleyball has moved to [place]; she plans  to play for [place] in the 2020 Olympics.  The patient is alone today.  Allergies: No Known Allergies  Current Medications: Current Outpatient Medications  Medication Sig Dispense Refill  . apixaban (ELIQUIS) 5 MG TABS tablet Take 1 tablet (5 mg total) by mouth 2 (two) times daily. 180 tablet 3  . aspirin 81 MG chewable tablet Chew by mouth.    . colchicine 0.6 MG tablet Two pills by mouth now, then one pill one hour later (Patient not taking: Reported on 07/31/2017) 6 tablet 0   No current facility-administered medications for this visit.     Review of Systems  Constitutional: Negative for diaphoresis, fever, malaise/fatigue and weight loss.  HENT: Negative.   Eyes: Negative.         Intermittent light sensation in right eye  Respiratory: Negative for cough, hemoptysis, sputum production and shortness of breath.   Cardiovascular: Negative for chest pain, palpitations, orthopnea, leg swelling and PND.  Gastrointestinal: Negative for abdominal pain, blood in stool, constipation, diarrhea, melena, nausea and vomiting.       Last colonoscopy was > 10 years ago  Genitourinary: Negative for dysuria, frequency, hematuria and urgency.  Musculoskeletal: Negative for back pain, falls, joint pain and myalgias.  Skin: Negative for itching and rash.  Neurological: Negative for dizziness, tremors, weakness and headaches.  Endo/Heme/Allergies: Does not bruise/bleed easily.  Psychiatric/Behavioral: Negative for depression, memory loss and suicidal ideas. The patient is not nervous/anxious and does not have insomnia.   All other systems reviewed and are negative.  Performance status (ECOG): 1 - Symptomatic but completely ambulatory  Vital Signs BP 109/78 (BP Location: Left Arm, Patient Position: Sitting)   Pulse 69   Temp 97.9 F (36.6 C) (Tympanic)   Resp 18   Ht '6\' 4"'  (1.93 m)   Wt 224 lb 8 oz (101.8 kg)   SpO2 97%   BMI 27.33 kg/m   Physical Exam  Constitutional: He is oriented to person, place, and time and well-developed, well-nourished, and in no distress.  HENT:  Head: Normocephalic and atraumatic.  Mouth/Throat: Oropharynx is clear and moist and mucous membranes are normal.  Alopecia.  Goatee.  Eyes: Pupils are equal, round, and reactive to light. EOM are normal. No scleral icterus.  Glasses.  Brown eyes.  Neck: Normal range of motion. Neck supple. No tracheal deviation present. No thyromegaly present.  Cardiovascular: Normal rate, regular rhythm, normal heart sounds and intact distal pulses. Exam reveals no gallop and no friction rub.  No murmur heard. Pulmonary/Chest: Effort normal and breath sounds normal. No respiratory distress. He has no wheezes. He has no  rales.  Abdominal: Soft. Bowel sounds are normal. He exhibits no distension. There is no tenderness.  Musculoskeletal: Normal range of motion. He exhibits no edema or tenderness.  Lymphadenopathy:    He has no cervical adenopathy.    He has no axillary adenopathy.       Right: No inguinal and no supraclavicular adenopathy present.       Left: No inguinal and no supraclavicular adenopathy present.  Neurological: He is alert and oriented to person, place, and time.  Skin: Skin is warm and dry. No rash noted. No erythema.  Psychiatric: Mood, affect and judgment normal.  Nursing note and vitals reviewed.   Appointment on 03/22/2018  Component Date Value Ref Range Status  . WBC 03/22/2018 3.5* 4.0 - 10.5 K/uL Final  . RBC 03/22/2018 4.67  4.22 - 5.81 MIL/uL Final  . Hemoglobin 03/22/2018 13.4  13.0 -  17.0 g/dL Final  . HCT 03/22/2018 40.7  39.0 - 52.0 % Final  . MCV 03/22/2018 87.2  80.0 - 100.0 fL Final  . MCH 03/22/2018 28.7  26.0 - 34.0 pg Final  . MCHC 03/22/2018 32.9  30.0 - 36.0 g/dL Final  . RDW 03/22/2018 12.6  11.5 - 15.5 % Final  . Platelets 03/22/2018 196  150 - 400 K/uL Final  . nRBC 03/22/2018 0.0  0.0 - 0.2 % Final  . Neutrophils Relative % 03/22/2018 50  % Final  . Neutro Abs 03/22/2018 1.8  1.7 - 7.7 K/uL Final  . Lymphocytes Relative 03/22/2018 38  % Final  . Lymphs Abs 03/22/2018 1.3  0.7 - 4.0 K/uL Final  . Monocytes Relative 03/22/2018 8  % Final  . Monocytes Absolute 03/22/2018 0.3  0.1 - 1.0 K/uL Final  . Eosinophils Relative 03/22/2018 3  % Final  . Eosinophils Absolute 03/22/2018 0.1  0.0 - 0.5 K/uL Final  . Basophils Relative 03/22/2018 1  % Final  . Basophils Absolute 03/22/2018 0.0  0.0 - 0.1 K/uL Final  . Immature Granulocytes 03/22/2018 0  % Final  . Abs Immature Granulocytes 03/22/2018 0.00  0.00 - 0.07 K/uL Final   Performed at [facility], [address] [place], [place] [address]  . Sodium 03/22/2018 139  135 - 145 mmol/L Final  . Potassium  03/22/2018 4.3  3.5 - 5.1 mmol/L Final  . Chloride 03/22/2018 108  98 - 111 mmol/L Final  . CO2 03/22/2018 25  22 - 32 mmol/L Final  . Glucose, Bld 03/22/2018 104* 70 - 99 mg/dL Final  . BUN 03/22/2018 17  8 - 23 mg/dL Final  . Creatinine, Ser 03/22/2018 1.48* 0.61 - 1.24 mg/dL Final  . Calcium 03/22/2018 9.2  8.9 - 10.3 mg/dL Final  . Total Protein 03/22/2018 7.1  6.5 - 8.1 g/dL Final  . Albumin 03/22/2018 4.1  3.5 - 5.0 g/dL Final  . AST 03/22/2018 26  15 - 41 U/L Final  . ALT 03/22/2018 20  0 - 44 U/L Final  . Alkaline Phosphatase 03/22/2018 59  38 - 126 U/L Final  . Total Bilirubin 03/22/2018 0.6  0.3 - 1.2 mg/dL Final  . GFR calc non Af Amer 03/22/2018 49* >60 mL/min Final  . GFR calc Af Amer 03/22/2018 57* >60 mL/min Final   Comment: (NOTE) The eGFR has been calculated using the CKD EPI equation. This calculation has not been validated in all clinical situations. eGFR's persistently <60 mL/min signify possible Chronic Kidney Disease.   Georgiann Hahn gap 03/22/2018 6  5 - 15 Final   Performed at [facility], [address], [place],  [address]    Assessment:  Anthany Thornhill is a 62 y.o. 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  Initial DVT was in his right calf approximately 13 years ago while in [place], [place]. He denied any precipitating events. He is extremely active. He was placed on Lovenox and converted to Coumadin.  He is unclear how long he was on Coumadin.  He then had an episode of disorientation years ago. He was diagnosed with a TIA. Coumadin was discontinued. He was placed on aspirin and Plavix.  He is off Plavix.  Right lower extremity duplex on 02/21/2016 revealed occlusive thrombus within one of the posterior tibial veins. There was apparent thrombus within additional veins at the popliteal fossa possibly reflecting gastrocnemius veins.  He is on aspirin and Eliquis.   Hypercoagulable work-up  on 03/11/2016 revealed the following normal studies:   CBC with diff, factor V Leiden, prothrombin gene mutation , lupus anticoagulant panel, anticardiolipin antibodies, and beta-2-glycoprotein antibodies.  Protein C, protein S, and ATIII (activity and antigen) were normal on 07/14/2016.  PSA was 1.7 on 08/27/2015.  His last colonoscopy was 11 years ago (late).  Cologuard was negative on 07/01/2017.  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  He has no family history of thrombosis.  His father had prostate cancer.  Symptomatically, patient is doing well overall.  He denies any acute physical complaints.  Exam is grossly unremarkable.  Patient continues on oral anticoagulation.  WBC 3500 (ANC 1800).  BUN 17 and creatinine 1.48 mg/dL (CrCl 63.5 mL/min).  Plan: 1. Labs today:  CBC with diff, CMP. 2. Recurrent right lower extremity DVT  Doing well overall.  Denies claudication pain or swelling.  Continues on chronic oral anticoagulation (apixaban) as previously prescribed.  Refill sent in to pharmacy today. 3. Renal insufficiency  BUN 17 and creatinine 1.48 mg/dL (CrCl 63.5 mL/min).  Baseline creatinine over the last year has been 1.28 to 1.37 mg/dL.  Patient encouraged to increase fluid intake.  We will continue routine lab monitoring. 4. RTC in 3 months for labs (CBC, BMP). 5. RTC in 6 months for MD assessment and labs (CBC with diff, CMP).   Honor Loh, NP  03/22/2018, 3:33 PM   I saw and evaluated the patient, participating in the key portions of the service and reviewing pertinent diagnostic studies and records.  I reviewed the nurse practitioner's note and agree with the findings and the plan.  The assessment and plan were discussed with the patient.  A few questions were asked by the patient and answered.   Nolon Stalls, MD 03/22/2018,3:33 PM

## 2018-03-22 NOTE — Progress Notes (Signed)
No new changes noted today 

## 2018-04-13 ENCOUNTER — Encounter: Payer: Self-pay | Admitting: Nurse Practitioner

## 2018-04-13 ENCOUNTER — Ambulatory Visit: Payer: BC Managed Care – PPO | Admitting: Nurse Practitioner

## 2018-04-13 ENCOUNTER — Telehealth: Payer: Self-pay

## 2018-04-13 VITALS — BP 100/60 | HR 82 | Temp 98.1°F | Resp 16 | Ht 76.0 in | Wt 228.0 lb

## 2018-04-13 DIAGNOSIS — M5431 Sciatica, right side: Secondary | ICD-10-CM

## 2018-04-13 MED ORDER — TIZANIDINE HCL 2 MG PO CAPS: 2.0000 mg | ORAL_CAPSULE | Freq: Every day | ORAL | 0 refills | Status: DC

## 2018-04-13 MED ORDER — PREDNISONE 10 MG (21) PO TBPK: ORAL_TABLET | ORAL | 0 refills | Status: DC

## 2018-04-13 NOTE — Telephone Encounter (Signed)
Copied from CRM [id number]. Topic: General - Other >> Apr 13, 2018 12:45 PM Fanny BienIlderton, Jessica L wrote: Reason for CRM: pt called and stated that he needs the work note to state that he can return to work on 04/16/18. Please advise

## 2018-04-13 NOTE — Patient Instructions (Addendum)
- please take prednisone as prescribed - Take muscle relaxer at night if needed - Ice/heat, OTC numbing creams - Light stretching ( not to cause pain)   Spondylolisthesis Rehab Ask your health care provider which exercises are safe for you. Do exercises exactly as told by your health care provider and adjust them as directed. It is normal to feel mild stretching, pulling, tightness, or discomfort as you do these exercises, but you should stop right away if you feel sudden pain or your pain gets worse. Do not begin these exercises until told by your health care provider. Stretching and range of motion exercises These exercises warm up your muscles and joints and improve the movement and flexibility of your hips and your back. These exercises may also help to relieve pain, numbness, and tingling. Exercise A: Single knee to chest  1. Lie on your back on a firm surface with both legs straight. 2. Bend one of your knees. Use your hands to move your knee up toward your chest until you feel a gentle stretch in your lower back and buttock. ? Hold your leg in this position by holding onto the front of your knee. ? Keep your other leg as straight as possible. 3. Hold for __________ seconds. 4. Slowly return to the starting position. 5. Repeat this exercise with your other leg. Repeat __________ times. Complete this exercise __________ times a day. Exercise B: Double knee to chest  1. Lie on your back on a firm surface with both legs straight. 2. Bend one of your knees and move it toward your chest until you feel a gentle stretch in your lower back and buttock. 3. Tense your abdominal muscles and repeat the previous step with your other leg. 4. Hold both of your legs in this position by holding onto the backs of your thighs or the fronts of your knees. 5. Hold for __________ seconds. 6. Tense your abdominal muscles and slowly move your legs back to the floor, one leg at a time. Repeat __________  times. Complete this exercise __________ times a day. Strengthening exercises These exercises build strength and endurance in your back. Endurance is the ability to use your muscles for a long time, even after they get tired. Exercise C: Pelvic tilt 1. Lie on your back on a firm bed or the floor. Bend your knees and keep your feet flat. 2. Tense your abdominal muscles. Tip your pelvis up toward the ceiling and flatten your lower back into the floor. ? To help with this exercise, you may place a small towel under your lower back and try to push your back into the towel. 3. Hold for __________ seconds. 4. Let your muscles relax completely before you repeat this exercise. Repeat __________ times. Complete this exercise __________ times a day. Exercise D: Abdominal crunch  1. Lie on your back on a firm surface. Bend your knees and keep your feet flat. Cross your arms over your chest. 2. Tuck your chin down toward your chest, without bending your neck. 3. Use your abdominal muscles to lift your upper body off of the ground, straight up into the air. ? Try to lift yourself until your shoulder blades are off the ground. You may need to work up to this. ? Keep your lower back on the ground while you crunch upward. ? Do not hold your breath. 4. Slowly lower yourself down. Keep your abdominal muscles tense until you are back to the starting position. Repeat __________ times. Complete  this exercise __________ times a day. Exercise E: Alternating arm and leg raises  1. Get on your hands and knees on a firm surface. If you are on a hard floor, you may want to use padding to cushion your knees, such as an exercise mat. 2. Line up your arms and legs. Your hands should be below your shoulders, and your knees should be below your hips. 3. Lift your left leg behind you. At the same time, raise your right arm and straighten it in front of you. ? Do not lift your leg higher than your hip. ? Do not lift your  arm higher than your shoulder. ? Keep your abdominal and back muscles tight. ? Keep your hips facing the ground. ? Do not arch your back. ? Keep your balance carefully, and do not hold your breath. 4. Hold for __________ seconds. 5. Slowly return to the starting position and repeat with your right leg and your left arm. Repeat __________ times. Complete this exercise __________ times a day. Posture and body mechanics  Body mechanics refers to the movements and positions of your body while you do your daily activities. Posture is part of body mechanics. Good posture and healthy body mechanics can help to relieve stress in your body's tissues and joints. Good posture means that your spine is in its natural S-curve position (your spine is neutral), your shoulders are pulled back slightly, and your head is not tipped forward. The following are general guidelines for applying improved posture and body mechanics to your everyday activities. Standing   When standing, keep your spine neutral and your feet about hip-width apart. Keep a slight bend in your knees. Your ears, shoulders, and hips should line up.  When you do a task in which you stand in one place for a long time, place one foot up on a stable object that is 2-4 inches (5-10 cm) high, such as a footstool. This helps keep your spine neutral. Sitting   When sitting, keep your spine neutral and keep your feet flat on the floor. Use a footrest, if necessary, and keep your thighs parallel to the floor. Avoid rounding your shoulders, and avoid tilting your head forward.  When working at a desk or a computer, keep your desk at a height where your hands are slightly lower than your elbows. Slide your chair under your desk so you are close enough to maintain good posture.  When working at a computer, place your monitor at a height where you are looking straight ahead and you do not have to tilt your head forward or downward to look at the  screen. Resting  When lying down and resting, avoid positions that are most painful for you.  If you have pain with activities such as sitting, bending, stooping, or squatting (flexion-based activities), lie in a position in which your body does not bend very much. For example, avoid curling up on your side with your arms and knees near your chest (fetal position).  If you have pain with activities such as standing for a long time or reaching with your arms (extension-based activities), lie with your spine in a neutral position and bend your knees slightly. Try the following positions: ? Lying on your side with a pillow between your knees. ? Lying on your back with a pillow under your knees.  Lifting   When lifting objects, keep your feet at least shoulder-width apart and tighten your abdominal muscles.  Bend your knees and hips  and keep your spine neutral. It is important to lift using the strength of your legs, not your back. Do not lock your knees straight out.  Always ask for help to lift heavy or awkward objects. This information is not intended to replace advice given to you by your health care provider. Make sure you discuss any questions you have with your health care provider. Document Released: 05/12/2005 Document Revised: 01/17/2016 Document Reviewed: 02/20/2015 Elsevier Interactive Patient Education  [employer]2018 [employer] Inc[employer]

## 2018-04-13 NOTE — Progress Notes (Signed)
Name: Nathaniel GlazierRicky [name]   MRN: [id number]    DOB: 03/29/1956   Date:04/13/2018       Progress Note  Subjective  Chief Complaint  Chief Complaint  Patient presents with  . Back Pain  . Leg Pain    right    HPI  Patient endorses lower back pain that has been ongoing for over ten years, states was sent to physical therapy in the past that helped significantly. Noticed when he stopped exercises pain would come back. States has been doing exercises recently but yesterday went to work and noticed worsening back pain and shooting down right leg. States he played basket ball but doesn't remember any sudden jerking or position changes. States used heating pad which allowed him to   Patient Active Problem List   Diagnosis Date Noted  . Long term current use of anticoagulant therapy 01/16/2017  . Rectal mass 12/29/2016  . Gout of right foot 09/24/2016  . Renal insufficiency 07/15/2016  . Foraminal stenosis of lumbar region 02/26/2016  . Degenerative disc disease, lumbar 02/26/2016  . 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 Elevated blood uric acid level 11/20/2015  . Annual physical exam 07/06/2015  . Elevated PSA, less than 10 ng/ml 07/06/2015  . Encounter for screening for malignant neoplasm of colon 07/06/2015  . Need for Tdap vaccination 07/06/2015  . Lumbar back pain 07/06/2015  . Hematospermia 06/11/2015  . CKD (chronic kidney disease) stage 2, GFR 60-89 ml/min 06/06/2015  . Prediabetes 06/06/2015  . History of TIA (transient ischemic attack) 06/06/2015  . History of elevated PSA   . History of DVT (deep vein thrombosis)     Past Medical History:  Diagnosis Date  . CKD (chronic kidney disease) stage 2, GFR 60-89 ml/min   . Gout   . History of DVT (deep vein thrombosis)    DVT right lower extremity in 2003 on coumadin for 3 months, INR levels subtherapeutic, started having slurred words and memory issues, extensive TIA/CVA work up done, no residual deficits.  Switched to asa plus plavix ever since and has done well thus far.  Marland Kitchen. History of elevated PSA    [place] in [place]: Prostate Biopsy Benign (Specialist wanted to do biopsies q6 months but patient did not think this was necessary and would rather monitor with annual PSA's)  . History of TIA (transient ischemic attack)   . Pre-diabetes     Past Surgical History:  Procedure Laterality Date  . BIOPSY PROSTATE  ?2012   neg  . [place]  2007   [place]    Social History   Tobacco Use  . Smoking status: Never Smoker  . Smokeless tobacco: Never Used  Substance Use Topics  . Alcohol use: Yes    Alcohol/week: 0.0 standard drinks     Current Outpatient Medications:  .  apixaban (ELIQUIS) 5 MG TABS tablet, Take 1 tablet (5 mg total) by mouth 2 (two) times daily., Disp: 180 tablet, Rfl: 0 .  aspirin 81 MG chewable tablet, Chew by mouth., Disp: , Rfl:  .  colchicine 0.6 MG tablet, Two pills by mouth now, then one pill one hour later (Patient not taking: Reported on 04/13/2018), Disp: 6 tablet, Rfl: 0  No Known Allergies  ROS   No other specific complaints in a complete review of systems (except as listed in HPI above).  Objective  Vitals:   04/13/18 1059  BP: 100/60  Pulse: 82  Resp: 16  Temp: 98.1  F (36.7 C)  TempSrc: Oral  SpO2: 98%  Weight: 228 lb (103.4 kg)  Height: 6\' 4"  (1.93 m)    Body mass index is 27.75 kg/m.  Nursing Note and Vital Signs reviewed.  Physical Exam  Constitutional: Patient appears well-developed and well-nourished. No distress.  HEENT: head atraumatic, normocephalic Cardiovascular: Normal rate Pulmonary/Chest: Effort normal  Musculoskeletal: no spinal tenderness, limited lumbar ROM due to pain.  Psychiatric: Patient has a normal mood and affect. behavior is normal. Judgment and thought content normal.    No results found for this or any previous visit (from the past 48 hour(s)).  Assessment & Plan  1. Sciatic nerve pain,  right Take phone out of right back pocket, ice/heat, light stretching, discussed warning signs. - predniSONE (STERAPRED UNI-PAK 21 TAB) 10 MG (21) TBPK tablet; Please take as directed.  Dispense: 21 tablet; Refill: 0 - tizanidine (ZANAFLEX) 2 MG capsule; Take 1 capsule (2 mg total) by mouth at bedtime.  Dispense: 10 capsule; Refill: 0

## 2018-04-15 ENCOUNTER — Other Ambulatory Visit: Payer: Self-pay | Admitting: Nurse Practitioner

## 2018-04-15 DIAGNOSIS — M79604 Pain in right leg: Secondary | ICD-10-CM

## 2018-04-15 NOTE — Telephone Encounter (Signed)
Please advise 

## 2018-04-15 NOTE — Telephone Encounter (Addendum)
Relation to pt: self  Call back number: [phone]   Reason for call:    Patient last seen by Cheryle HorsfallPoulose, Elizabeth E, NP 04/13/2018 and states he takes the muscle relaxer prior to going to bed, 3rd day of taking steroids. Patient experiencing throbbing and hurting only when walking, able to sleep at night, light exercise as PCP recommended and patient currently using a crutch to support pressure.   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 Dr. Note currently reflects return back to work 04/16/18 tomorrow, patient requesting extension until Monday 04/19/18 please upload Dr. Note to My Chart and advise patient regarding status.

## 2018-04-15 NOTE — Telephone Encounter (Signed)
Urgent referral placed for orthopedic evaluation; Crystal- please provide work note until Monday

## 2018-04-20 ENCOUNTER — Other Ambulatory Visit: Payer: Self-pay | Admitting: Nurse Practitioner

## 2018-04-20 DIAGNOSIS — M5431 Sciatica, right side: Secondary | ICD-10-CM

## 2018-04-28 ENCOUNTER — Ambulatory Visit: Payer: BC Managed Care – PPO | Attending: Orthopedic Surgery | Admitting: Physical Therapy

## 2018-05-03 ENCOUNTER — Encounter: Payer: BC Managed Care – PPO | Admitting: Physical Therapy

## 2018-05-04 ENCOUNTER — Other Ambulatory Visit: Payer: Self-pay | Admitting: Nurse Practitioner

## 2018-05-04 DIAGNOSIS — M5431 Sciatica, right side: Secondary | ICD-10-CM

## 2018-05-06 ENCOUNTER — Ambulatory Visit: Payer: BC Managed Care – PPO | Admitting: Physical Therapy

## 2018-05-10 ENCOUNTER — Encounter: Payer: BC Managed Care – PPO | Admitting: Physical Therapy

## 2018-05-11 ENCOUNTER — Other Ambulatory Visit: Payer: Self-pay | Admitting: Nurse Practitioner

## 2018-05-11 ENCOUNTER — Encounter: Payer: BC Managed Care – PPO | Admitting: Physical Therapy

## 2018-05-11 DIAGNOSIS — M5431 Sciatica, right side: Secondary | ICD-10-CM

## 2018-05-18 ENCOUNTER — Encounter: Payer: BC Managed Care – PPO | Admitting: Physical Therapy

## 2018-05-20 ENCOUNTER — Encounter: Payer: BC Managed Care – PPO | Admitting: Physical Therapy

## 2018-06-21 ENCOUNTER — Inpatient Hospital Stay: Payer: BC Managed Care – PPO | Attending: Hematology and Oncology

## 2018-07-05 ENCOUNTER — Other Ambulatory Visit: Payer: Self-pay | Admitting: Urgent Care

## 2018-09-20 ENCOUNTER — Other Ambulatory Visit: Payer: BC Managed Care – PPO

## 2018-09-20 ENCOUNTER — Ambulatory Visit: Payer: BC Managed Care – PPO | Admitting: Hematology and Oncology

## 2019-08-05 ENCOUNTER — Ambulatory Visit: Payer: BC Managed Care – PPO | Attending: Internal Medicine

## 2019-08-05 DIAGNOSIS — Z23 Encounter for immunization: Secondary | ICD-10-CM

## 2019-08-05 NOTE — Progress Notes (Signed)
   Covid-19 Vaccination Clinic  Name:  Nathaniel [name]    MRN: [id number] DOB: [birth year]  08/05/2019  [name] was observed post Covid-19 immunization for 15 minutes without incident. He was provided with Vaccine Information Sheet and instruction to access the V-Safe system.   [name] was instructed to call 911 with any severe reactions post vaccine: Marland Kitchen Difficulty breathing  . Swelling of face and throat  . A fast heartbeat  . A bad rash all over body  . Dizziness and weakness   Immunizations Administered    Name Date Dose VIS Date Route   Moderna COVID-19 Vaccine 08/05/2019 10:49 AM 0.5 mL 04/26/2019 Intramuscular   Manufacturer: Moderna   Lot: [id number]   NDC: 03212-248-25

## 2019-09-06 ENCOUNTER — Ambulatory Visit: Payer: BC Managed Care – PPO | Attending: Internal Medicine

## 2019-09-06 DIAGNOSIS — Z23 Encounter for immunization: Secondary | ICD-10-CM

## 2019-09-06 NOTE — Progress Notes (Signed)
   Covid-19 Vaccination Clinic  Name:  Nathaniel [name]    MRN: [id number] DOB: 1956/02/01  09/06/2019  Mr. Nathaniel [name] was observed post Covid-19 immunization for 15 minutes without incident. He was provided with Vaccine Information Sheet and instruction to access the V-Safe system.   Mr. Nathaniel [name] was instructed to call 911 with any severe reactions post vaccine: Marland Kitchen Difficulty breathing  . Swelling of face and throat  . A fast heartbeat  . A bad rash all over body  . Dizziness and weakness   Immunizations Administered    Name Date Dose VIS Date Route   Moderna COVID-19 Vaccine 09/06/2019 10:52 AM 0.5 mL 04/26/2019 Intramuscular   Manufacturer: Moderna   Lot: [id number]   NDC: 22979-892-11

## 2020-04-11 ENCOUNTER — Other Ambulatory Visit: Payer: Self-pay

## 2020-04-11 ENCOUNTER — Encounter: Payer: Self-pay | Admitting: Emergency Medicine

## 2020-04-11 ENCOUNTER — Ambulatory Visit
Admission: EM | Admit: 2020-04-11 | Discharge: 2020-04-11 | Disposition: A | Discharge status: Home / Self Care | Payer: Non-veteran care | Attending: Family Medicine | Admitting: Family Medicine

## 2020-04-11 DIAGNOSIS — L03012 Cellulitis of left finger: Secondary | ICD-10-CM | POA: Diagnosis not present

## 2020-04-11 DIAGNOSIS — M7989 Other specified soft tissue disorders: Secondary | ICD-10-CM

## 2020-04-11 MED ORDER — SULFAMETHOXAZOLE-TRIMETHOPRIM 800-160 MG PO TABS: 1.0000 | ORAL_TABLET | Freq: Two times a day (BID) | ORAL | 0 refills | Status: AC

## 2020-04-11 MED ORDER — MUPIROCIN 2 % EX OINT: 1.0000 "application " | TOPICAL_OINTMENT | Freq: Three times a day (TID) | CUTANEOUS | 0 refills | Status: AC

## 2020-04-11 NOTE — ED Triage Notes (Signed)
Pt c/o left ring finger pain. He states he clipped his finger nails about  and knew he clipped them too short. Started having swelling, redness and pain about 2 days ago.

## 2020-04-11 NOTE — Discharge Instructions (Signed)
Use warm soaks on the tip of the finger.  Take antibiotics for the next couple of days to clear up any remaining infection.  You can do Tylenol for pain relief.  Rest the finger is swollen so he should ice the other areas of swelling.  If the swelling does not come down and you cannot get your rings off after the next couple days or you start develop more pain in the finger you may have to return and let us remove the rings-we may have to cut them off.

## 2020-04-11 NOTE — ED Provider Notes (Signed)
[facility]    CSN: [id number] Arrival date & time: 04/11/20  0909      History   Chief Complaint Chief Complaint  Patient presents with  . Hand Pain    HPI Nathaniel [name] is a 64 y.o. male presenting for pain around the left ring finger for the past 2 days.  He says he was cutting his fingernails and clipped them a little bit too short and one area.  He developed redness and swelling around the cuticle area after.  He says that he has been icing the finger and pushed a lot of pus out of the area yesterday.  He says that that improved the pain a little bit.  He says that the entire finger is swollen and he has not been able to get his rings off.  He denies any associated fevers or fatigue.  He has no other complaints or concerns today.  HPI  Past Medical History:  Diagnosis Date  . CKD (chronic kidney disease) stage 2, GFR 60-89 ml/min   . Gout   . History of DVT (deep vein thrombosis)    DVT right lower extremity in 2003 on coumadin for 3 months, INR levels subtherapeutic, started having slurred words and memory issues, extensive TIA/CVA work up done, no residual deficits. Switched to asa plus plavix ever since and has done well thus far.  Marland Kitchen History of elevated PSA    Has seen Urologist in [place]: Prostate Biopsy Benign (Specialist wanted to do biopsies q6 months but patient did not think this was necessary and would rather monitor with annual PSA's)  . History of TIA (transient ischemic attack)   . Pre-diabetes     Patient Active Problem List   Diagnosis Date Noted  . Long term current use of anticoagulant therapy 01/16/2017  . Rectal mass 12/29/2016  . Gout of right foot 09/24/2016  . Renal insufficiency 07/15/2016  . Foraminal stenosis of lumbar region 02/26/2016  . Degenerative disc disease, lumbar 02/26/2016  . 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 Elevated blood uric acid level 11/20/2015  . Annual physical exam 07/06/2015  .  Elevated PSA, less than 10 ng/ml 07/06/2015  . Encounter for screening for malignant neoplasm of colon 07/06/2015  . Need for Tdap vaccination 07/06/2015  . Lumbar back pain 07/06/2015  . Hematospermia 06/11/2015  . CKD (chronic kidney disease) stage 2, GFR 60-89 ml/min 06/06/2015  . Prediabetes 06/06/2015  . History of TIA (transient ischemic attack) 06/06/2015  . History of elevated PSA   . History of DVT (deep vein thrombosis)     Past Surgical History:  Procedure Laterality Date  . BIOPSY PROSTATE  ?2012   neg  . COLONOSCOPY  2007   in [place]       Home Medications    Prior to Admission medications   Medication Sig Start Date End Date Taking? Authorizing Provider  aspirin 81 MG chewable tablet Chew by mouth.   Yes [provider]  ELIQUIS 5 MG TABS tablet TAKE 1 TABLET BY MOUTH TWICE DAILY 07/05/18  Yes Corcoran, Ferdie Ping, MD  mupirocin ointment (BACTROBAN) 2 % Apply 1 application topically 3 (three) times daily for 7 days. 04/11/20 04/18/20  Shirlee Latch, PA-C  predniSONE (STERAPRED UNI-PAK 21 TAB) 10 MG (21) TBPK tablet Please take as directed. 04/13/18   Poulose, Percell Belt, NP  sulfamethoxazole-trimethoprim (BACTRIM DS) 800-160 MG tablet Take 1 tablet by mouth 2 (two) times daily for 5 days.  04/11/20 04/16/20  Eusebio Friendly B, PA-C  tizanidine (ZANAFLEX) 2 MG capsule TAKE 1 CAPSULE(2 MG) BY MOUTH AT BEDTIME 05/11/18   Poulose, Percell Belt, NP    Family History Family History  Problem Relation Age of Onset  . Kidney disease Mother        Dialysis  . Cancer Mother        Breast  . Diabetes Father   . Cancer Father 40       Prostate  . Gout Sister     Social History Social History   Tobacco Use  . Smoking status: Never Smoker  . Smokeless tobacco: Never Used  Vaping Use  . Vaping Use: Never used  Substance Use Topics  . Alcohol use: Yes    Alcohol/week: 0.0 standard drinks  . Drug use: No     Allergies   Patient has no known  allergies.   Review of Systems Review of Systems  Constitutional: Negative for fatigue and fever.  Musculoskeletal: Positive for joint swelling. Negative for arthralgias.  Skin: Positive for color change. Negative for rash and wound.  Neurological: Negative for weakness.     Physical Exam Triage Vital Signs ED Triage Vitals  Enc Vitals Group     BP 04/11/20 1017 115/76     Pulse Rate 04/11/20 1017 67     Resp 04/11/20 1017 18     Temp 04/11/20 1017 98 F (36.7 C)     Temp Source 04/11/20 1017 Oral     SpO2 04/11/20 1017 100 %     Weight 04/11/20 1015 227 lb 15.3 oz (103.4 kg)     Height 04/11/20 1015 6\' 4"  (1.93 m)     Head Circumference --      Peak Flow --      Pain Score 04/11/20 1015 3     Pain Loc --      Pain Edu? --      Excl. in GC? --    No data found.  Updated Vital Signs BP 115/76 (BP Location: Right Arm)   Pulse 67   Temp 98 F (36.7 C) (Oral)   Resp 18   Ht 6\' 4"  (1.93 m)   Wt 227 lb 15.3 oz (103.4 kg)   SpO2 100%   BMI 27.75 kg/m       Physical Exam Vitals and nursing note reviewed.  Constitutional:      General: He is not in acute distress.    Appearance: Normal appearance. He is well-developed. He is not ill-appearing, toxic-appearing or diaphoretic.  HENT:     Head: Normocephalic and atraumatic.  Eyes:     General: No scleral icterus.    Conjunctiva/sclera: Conjunctivae normal.  Cardiovascular:     Rate and Rhythm: Normal rate and regular rhythm.     Pulses: Normal pulses.  Pulmonary:     Effort: Pulmonary effort is normal. No respiratory distress.  Musculoskeletal:     Cervical back: Neck supple.  Skin:    General: Skin is warm and dry.     Comments: There is mild to moderate swelling of the distal left ring finger especially around the cuticle.  This area is tender.  There is some crusting at the cuticle bed.  No drainage or bleeding.  The rest the finger is mildly swollen and I am unable to pull the rings off.  He has 2 rings   Neurological:     General: No focal deficit present.     Mental Status: He  is alert. Mental status is at baseline.     Motor: No weakness.     Gait: Gait normal.  Psychiatric:        Mood and Affect: Mood normal.        Behavior: Behavior normal.        Thought Content: Thought content normal.      UC Treatments / Results  Labs (all labs ordered are listed, but only abnormal results are displayed) Labs Reviewed - No data to display  EKG   Radiology No results found.  Procedures Procedures (including critical care time)  Medications Ordered in UC Medications - No data to display  Initial Impression / Assessment and Plan / UC Course  I have reviewed the triage vital signs and the nursing notes.  Pertinent labs & imaging results that were available during my care of the patient were reviewed by me and considered in my medical decision making (see chart for details).   Since the area has already started to drain on its own, advised him to start warm compresses or warm soaks the area a couple times a day.  Prescribing a short course of Bactrim and mupirocin ointment to clear for me infection.  Advised him to ice the other areas swelling of his finger and hopefully we can get his rings off but if he cannot advised him to return and you may have to cut them off.  He is understanding and agreeable but not wanting to get the rings off at this time.  Final Clinical Impressions(s) / UC Diagnoses   Final diagnoses:  Paronychia of left ring finger  Swelling of left ring finger     Discharge Instructions     Use warm soaks on the tip of the finger.  Take antibiotics for the next couple of days to clear up any remaining infection.  You can do Tylenol for pain relief.  Rest the finger is swollen so he should ice the other areas of swelling.  If the swelling does not come down and you cannot get your rings off after the next couple days or you start develop more pain in the finger you  may have to return and let us remove the rings-we may have to cut them off.    ED Prescriptions    Medication Sig Dispense Auth. Provider   sulfamethoxazole-trimethoprim (BACTRIM DS) 800-160 MG tablet Take 1 tablet by mouth 2 (two) times daily for 5 days. 10 tablet Eusebio Friendly B, PA-C   mupirocin ointment (BACTROBAN) 2 % Apply 1 application topically 3 (three) times daily for 7 days. 21 g Shirlee Latch, PA-C     PDMP not reviewed this encounter.   Shirlee Latch, PA-C 04/11/20 1107

## 2020-06-16 ENCOUNTER — Ambulatory Visit
Admission: EM | Admit: 2020-06-16 | Discharge: 2020-06-16 | Disposition: A | Discharge status: Home / Self Care | Payer: Medicare Other | Attending: Physician Assistant | Admitting: Physician Assistant

## 2020-06-16 ENCOUNTER — Encounter: Payer: Self-pay | Admitting: Emergency Medicine

## 2020-06-16 ENCOUNTER — Other Ambulatory Visit: Payer: Self-pay

## 2020-06-16 ENCOUNTER — Ambulatory Visit
Admission: EM | Admit: 2020-06-16 | Discharge: 2020-06-16 | Disposition: A | Discharge status: Other Acute Inpatient Hospital | Payer: Medicare Other

## 2020-06-16 DIAGNOSIS — Z0189 Encounter for other specified special examinations: Secondary | ICD-10-CM | POA: Diagnosis not present

## 2020-06-16 DIAGNOSIS — Z20822 Contact with and (suspected) exposure to covid-19: Secondary | ICD-10-CM | POA: Insufficient documentation

## 2020-06-16 NOTE — ED Triage Notes (Signed)
Patient here for a COVID Test for travel.  Patient denies any symptoms.

## 2020-06-16 NOTE — Discharge Instructions (Signed)

## 2020-06-18 LAB — SARS CORONAVIRUS 2 (TAT 6-24 HRS): SARS Coronavirus 2: NEGATIVE

## 2020-07-28 ENCOUNTER — Ambulatory Visit (INDEPENDENT_AMBULATORY_CARE_PROVIDER_SITE_OTHER): Payer: Medicare Other

## 2020-07-28 ENCOUNTER — Other Ambulatory Visit: Payer: Self-pay

## 2020-07-28 ENCOUNTER — Ambulatory Visit
Admission: RE | Admit: 2020-07-28 | Discharge: 2020-07-28 | Disposition: A | Discharge status: Home / Self Care | Payer: Medicare Other | Source: Ambulatory Visit | Attending: Sports Medicine | Admitting: Sports Medicine

## 2020-07-28 VITALS — BP 131/80 | HR 84 | Temp 98.2°F | Resp 18 | Ht 76.0 in | Wt 227.1 lb

## 2020-07-28 DIAGNOSIS — M654 Radial styloid tenosynovitis [de Quervain]: Secondary | ICD-10-CM

## 2020-07-28 DIAGNOSIS — M25531 Pain in right wrist: Secondary | ICD-10-CM

## 2020-07-28 DIAGNOSIS — M25432 Effusion, left wrist: Secondary | ICD-10-CM | POA: Diagnosis not present

## 2020-07-28 DIAGNOSIS — M5431 Sciatica, right side: Secondary | ICD-10-CM

## 2020-07-28 DIAGNOSIS — Y93B2 Activity, push-ups, pull-ups, sit-ups: Secondary | ICD-10-CM

## 2020-07-28 DIAGNOSIS — M25532 Pain in left wrist: Secondary | ICD-10-CM | POA: Diagnosis not present

## 2020-07-28 MED ORDER — PREDNISONE 10 MG (21) PO TBPK: ORAL_TABLET | ORAL | 0 refills | Status: DC

## 2020-07-28 NOTE — ED Provider Notes (Signed)
[facility]    CSN: [id number] Arrival date & time: 07/28/20  1308      History   Chief Complaint Chief Complaint  Patient presents with  . Appointment  . Wrist Pain    left    HPI Nathaniel [name] is a 65 y.o. male.   Is a 65 year old right-hand-dominant male who presents for evaluation of the above issue.  He reports that he was doing some push-ups about 3 days ago and had to stop and not finished his third set due to some discomfort in his wrist.  He denies any accidents trauma or falls.  He did not feel or hear a pop.  Most of his pain is located over the distal radius and over the first and second dorsal compartments.  He is also tender over the dorsal aspect of his wrist.  He did some icing and yesterday got real bad that he went out and bought a over-the-counter wrist brace which she has been using.  He reports a remote injury back in 1996 while playing basketball he says that he "broke both wrists".  Was seen via hand and wrist specialist in Ortho but did not get surgery.  He does have a history of gout but it has been in his big toe.  He has not had any issues with that in years.  He was concerned because the pain had increased and he noted some associated swelling and wanted to be evaluated here in the urgent care today.  No red flag signs or symptoms elicited on history.     Past Medical History:  Diagnosis Date  . CKD (chronic kidney disease) stage 2, GFR 60-89 ml/min   . Gout   . History of DVT (deep vein thrombosis)    DVT right lower extremity in 2003 on coumadin for 3 months, INR levels subtherapeutic, started having slurred words and memory issues, extensive TIA/CVA work up done, no residual deficits. Switched to asa plus plavix ever since and has done well thus far.  Marland Kitchen History of elevated PSA    Has seen Urologist in [place]: Prostate Biopsy Benign (Specialist wanted to do biopsies q6 months but patient did not think this was necessary and would rather monitor  with annual PSA's)  . History of TIA (transient ischemic attack)   . Pre-diabetes     Patient Active Problem List   Diagnosis Date Noted  . Long term current use of anticoagulant therapy 01/16/2017  . Rectal mass 12/29/2016  . Gout of right foot 09/24/2016  . Renal insufficiency 07/15/2016  . Foraminal stenosis of lumbar region 02/26/2016  . Degenerative disc disease, lumbar 02/26/2016  . 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 Elevated blood uric acid level 11/20/2015  . Annual physical exam 07/06/2015  . Elevated PSA, less than 10 ng/ml 07/06/2015  . Encounter for screening for malignant neoplasm of colon 07/06/2015  . Need for Tdap vaccination 07/06/2015  . Lumbar back pain 07/06/2015  . Hematospermia 06/11/2015  . CKD (chronic kidney disease) stage 2, GFR 60-89 ml/min 06/06/2015  . Prediabetes 06/06/2015  . History of TIA (transient ischemic attack) 06/06/2015  . History of elevated PSA   . History of DVT (deep vein thrombosis)     Past Surgical History:  Procedure Laterality Date  . BIOPSY PROSTATE  ?2012   neg  . COLONOSCOPY  2007   in [place]       Home Medications    Prior to Admission medications  Medication Sig Start Date End Date Taking? Authorizing Provider  aspirin 81 MG chewable tablet Chew by mouth.   Yes [provider]  ELIQUIS 5 MG TABS tablet TAKE 1 TABLET BY MOUTH TWICE DAILY 07/05/18  Yes Corcoran, Melissa C, MD  predniSONE (STERAPRED UNI-PAK 21 TAB) 10 MG (21) TBPK tablet Please take as directed. 07/28/20   Delton See, MD  tizanidine (ZANAFLEX) 2 MG capsule TAKE 1 CAPSULE(2 MG) BY MOUTH AT BEDTIME 05/11/18   Poulose, Percell Belt, NP    Family History Family History  Problem Relation Age of Onset  . Kidney disease Mother        Dialysis  . Cancer Mother        Breast  . Diabetes Father   . Cancer Father 80       Prostate  . Gout Sister     Social History Social History   Tobacco Use  . Smoking  status: Former Smoker    Years: 3.00    Quit date: 06/10/1989    Years since quitting: 31.1  . Smokeless tobacco: Never Used  Vaping Use  . Vaping Use: Never used  Substance Use Topics  . Alcohol use: Yes    Alcohol/week: 0.0 standard drinks  . Drug use: No     Allergies   Patient has no known allergies.   Review of Systems Review of Systems  Constitutional: Positive for activity change. Negative for appetite change, chills, diaphoresis, fatigue and fever.  HENT: Negative.   Eyes: Negative.   Respiratory: Negative.   Cardiovascular: Negative.   Genitourinary: Negative.   Musculoskeletal: Positive for arthralgias, joint swelling and myalgias. Negative for back pain, gait problem, neck pain and neck stiffness.  Skin: Negative.  Negative for color change, pallor, rash and wound.  Neurological: Negative for dizziness, seizures, light-headedness, numbness and headaches.  All other systems reviewed and are negative.    Physical Exam Triage Vital Signs ED Triage Vitals  Enc Vitals Group     BP 07/28/20 1319 131/80     Pulse Rate 07/28/20 1319 84     Resp 07/28/20 1319 18     Temp 07/28/20 1319 98.2 F (36.8 C)     Temp Source 07/28/20 1319 Oral     SpO2 07/28/20 1319 100 %     Weight 07/28/20 1317 227 lb 1.2 oz (103 kg)     Height 07/28/20 1317 6\' 4"  (1.93 m)     Head Circumference --      Peak Flow --      Pain Score 07/28/20 1317 9     Pain Loc --      Pain Edu? --      Excl. in GC? --    No data found.  Updated Vital Signs BP 131/80 (BP Location: Right Arm)   Pulse 84   Temp 98.2 F (36.8 C) (Oral)   Resp 18   Ht 6\' 4"  (1.93 m)   Wt 103 kg   SpO2 100%   BMI 27.64 kg/m   Visual Acuity Right Eye Distance:   Left Eye Distance:   Bilateral Distance:    Right Eye Near:   Left Eye Near:    Bilateral Near:     Physical Exam Vitals and nursing note reviewed.  Constitutional:      General: He is not in acute distress.    Appearance: Normal appearance.  He is not ill-appearing, toxic-appearing or diaphoretic.  HENT:     Head: Normocephalic and atraumatic.  Cardiovascular:     Rate and Rhythm: Normal rate and regular rhythm.     Pulses: Normal pulses.     Heart sounds: Normal heart sounds.  Pulmonary:     Effort: Pulmonary effort is normal.     Breath sounds: Normal breath sounds.  Musculoskeletal:        General: Swelling and tenderness present. No deformity.     Cervical back: Normal range of motion and neck supple.     Comments: Right hand and wrist: Normal to inspection palpation range of motion special test.  Left hand and wrist there is no obvious bony abnormality ecchymosis or erythema.  He does have some soft tissue swelling mostly over the first and second dorsal compartments of the distal radius.  Finkelstein's test is positive.  He is very tender over that distal radius and over the first and second dorsal compartments.  He is also tender to palpation in the wrist joint dorsally.  Has decreased range of motion in all planes especially with wrist extension.  His grip strength is well-preserved.  No involvement of the hand.  Elbow exam is also within normal limits.  No significant laxity but limited exam secondary to the pain and swelling.  Neurovascular: Normal sensation 2+ pulses distally.  Skin:    General: Skin is warm.     Capillary Refill: Capillary refill takes less than 2 seconds.     Findings: No bruising, erythema, lesion or rash.  Neurological:     General: No focal deficit present.     Mental Status: He is alert and oriented to person, place, and time.      UC Treatments / Results  Labs (all labs ordered are listed, but only abnormal results are displayed) Labs Reviewed - No data to display  EKG   Radiology DG Wrist Complete Left  Result Date: 07/28/2020 CLINICAL DATA:  Pain and swelling after doing pushups today. Pain at scaphoid. EXAM: LEFT WRIST - COMPLETE 3+ VIEW COMPARISON:  None. FINDINGS: There is no  evidence of fracture or dislocation. There is no evidence of arthropathy or other focal bone abnormality. Soft tissues are unremarkable. IMPRESSION: Negative. Electronically Signed   By: Gerome Samavid  Williams III M.D   On: 07/28/2020 14:18    Procedures Procedures (including critical care time)  Medications Ordered in UC Medications - No data to display  Initial Impression / Assessment and Plan / UC Course  I have reviewed the triage vital signs and the nursing notes.  Pertinent labs & imaging results that were available during my care of the patient were reviewed by me and considered in my medical decision making (see chart for details).   Clinical impression: 3 days of left wrist pain.  His exam is consistent with a wrist sprain versus de Quervain's tenosynovitis.  Treatment plan: 1.  The findings and treatment plan were discussed in detail with the patient.  Patient was in agreement. 2.  Gave him educational handout. 3.  I wanted to put him on a prescription strength anti-inflammatory, Meloxicam 15 mg daily however he is on Eliquis.  Will prescribe a short course of steroids to help with the inflammation. 4.  We will refer him to orthopedics for follow-up.  May benefit from a cortisone injection plus or minus physical therapy.  If the steroids calm things down he can always cancel that. 5.  X-rays were done and the results are above.  No acute findings. 6.  Work note not needed. 7.  Can take Tylenol  only given that he is on Eliquis no other NSAIDs. 8.  Just supportive care for now icing and he can use the brace as needed. 9.  Follow-up here as needed.    Final Clinical Impressions(s) / UC Diagnoses   Final diagnoses:  Acute pain of left wrist  De Quervain's tenosynovitis, left  Pain and swelling of left wrist     Discharge Instructions     You have a wrist sprain and de Quervain's tenosynovitis. I have prescribed a short course of steroids as anti-inflammatories are  contraindicated while you are on Eliquis. Gave you an Production manager. Gave you a number to call so you can follow-up with orthopedics.  You may benefit from a cortisone injection or physical therapy or both.  I will leave it to the discretion of the orthopedist as to what she needed next. You can use Tylenol only for any discomfort you may be having. You can use the wrist brace but try to come out of it and work on range of motion so you do not stiffen up. Continue to ice several times a day but no more than 20 minutes every 2 hours. If you have any problems and cannot get into the orthopedist please come back.  I hope you get feeling better, Dr. Zachery Dauer    ED Prescriptions    Medication Sig Dispense Auth. Provider   predniSONE (STERAPRED UNI-PAK 21 TAB) 10 MG (21) TBPK tablet Please take as directed. 21 tablet Delton See, MD     PDMP not reviewed this encounter.   Delton See, MD 07/28/20 [phone]

## 2020-07-28 NOTE — Discharge Instructions (Signed)
You have a wrist sprain and de Quervain's tenosynovitis. I have prescribed a short course of steroids as anti-inflammatories are contraindicated while you are on Eliquis. Gave you an Production manager. Gave you a number to call so you can follow-up with orthopedics.  You may benefit from a cortisone injection or physical therapy or both.  I will leave it to the discretion of the orthopedist as to what she needed next. You can use Tylenol only for any discomfort you may be having. You can use the wrist brace but try to come out of it and work on range of motion so you do not stiffen up. Continue to ice several times a day but no more than 20 minutes every 2 hours. If you have any problems and cannot get into the orthopedist please come back.  I hope you get feeling better, Dr. Zachery Dauer

## 2020-07-28 NOTE — ED Triage Notes (Signed)
Pt c/o left wrist pain. Started about 3 days ago. He states he was doing push ups and started hurting when doing the last push up. He is wearing a wrist brace during triage.

## 2021-01-17 ENCOUNTER — Ambulatory Visit (INDEPENDENT_AMBULATORY_CARE_PROVIDER_SITE_OTHER): Payer: Medicare Other | Admitting: Unknown Physician Specialty

## 2021-01-17 ENCOUNTER — Other Ambulatory Visit: Payer: Self-pay

## 2021-01-17 ENCOUNTER — Encounter: Payer: Self-pay | Admitting: Unknown Physician Specialty

## 2021-01-17 VITALS — BP 124/80 | HR 78 | Temp 98.1°F | Resp 18 | Ht 76.0 in | Wt 229.7 lb

## 2021-01-17 DIAGNOSIS — R972 Elevated prostate specific antigen [PSA]: Secondary | ICD-10-CM | POA: Diagnosis not present

## 2021-01-17 DIAGNOSIS — Z23 Encounter for immunization: Secondary | ICD-10-CM

## 2021-01-17 DIAGNOSIS — Z7189 Other specified counseling: Secondary | ICD-10-CM

## 2021-01-17 DIAGNOSIS — Z0000 Encounter for general adult medical examination without abnormal findings: Secondary | ICD-10-CM

## 2021-01-17 DIAGNOSIS — Z7901 Long term (current) use of anticoagulants: Secondary | ICD-10-CM | POA: Diagnosis not present

## 2021-01-17 NOTE — Assessment & Plan Note (Signed)
A voluntary discussion about advance care planning including the explanation and discussion of advance directives was extensively discussed  with the patient.  Explanation about the health care proxy and Living will was reviewed.  During this discussion, the patient was able to identify a health care proxy as his wife and oldest daughter as a secondary and plans to fill out the paperwork required.  Patient was offered a separate Advance Care Planning visit for further assistance with forms.  Time spent:   20     Individuals present:  Patient

## 2021-01-17 NOTE — Progress Notes (Signed)
BP 124/80   Pulse 78   Temp 98.1 F (36.7 C) (Oral)   Resp 18   Ht 6\' 4"  (1.93 m)   Wt 229 lb 11.2 oz (104.2 kg)   SpO2 98%   BMI 27.96 kg/m    Subjective:    Patient ID: Nathaniel [name], male    DOB: 1955/06/19, 65 y.o.   MRN: [id number]  HPI: Nathaniel [name] is a 65 y.o. male presenting on 01/17/2021 for comprehensive medical examination. Current medical complaints include:none  He currently lives with: wife Interim Problems from his last visit: None - 01/19/2021 in [place] and taking a pectin supplement for metals  Functional Status Survey: Is the patient deaf or have difficulty hearing?: No Does the patient have difficulty seeing, even when wearing glasses/contacts?: No Does the patient have difficulty concentrating, remembering, or making decisions?: No Does the patient have difficulty walking or climbing stairs?: No Does the patient have difficulty dressing or bathing?: No Does the patient have difficulty doing errands alone such as visiting a doctor's office or shopping?: No  FALL RISK: Fall Risk  01/17/2021 04/13/2018 09/24/2016 02/21/2016 11/12/2015  Falls in the past year? 0 0 No No No  Number falls in past yr: 0 - - - -  Comment - - - - -  Injury with Fall? 0 - - - -  Follow up Falls evaluation completed - - - -    Depression Screen Depression screen [facility] 2/9 01/17/2021 04/13/2018 09/24/2016 02/21/2016 11/12/2015  Decreased Interest 0 0 0 0 0  Down, Depressed, Hopeless 0 0 0 0 0  PHQ - 2 Score 0 0 0 0 0    Advanced Directives <no information>  Past Medical History:  Past Medical History:  Diagnosis Date   CKD (chronic kidney disease) stage 2, GFR 60-89 ml/min    Gout    History of DVT (deep vein thrombosis)    DVT right lower extremity in 2003 on coumadin for 3 months, INR levels subtherapeutic, started having slurred words and memory issues, extensive TIA/CVA work up done, no residual deficits. Switched to asa plus plavix ever since and has done well  thus far.   History of elevated PSA    Has seen Urologist in 2004: Prostate Biopsy Benign (Specialist wanted to do biopsies q6 months but patient did not think this was necessary and would rather monitor with annual PSA's)   History of TIA (transient ischemic attack)    Pre-diabetes     Surgical History:  Past Surgical History:  Procedure Laterality Date   BIOPSY PROSTATE  ?2012   neg   COLONOSCOPY  2007   in [place]    Medications:  Current Outpatient Medications on File Prior to Visit  Medication Sig   aspirin 81 MG chewable tablet Chew by mouth.   ELIQUIS 5 MG TABS tablet TAKE 1 TABLET BY MOUTH TWICE DAILY   predniSONE (STERAPRED UNI-PAK 21 TAB) 10 MG (21) TBPK tablet Please take as directed. (Patient not taking: Reported on 01/17/2021)   tizanidine (ZANAFLEX) 2 MG capsule TAKE 1 CAPSULE(2 MG) BY MOUTH AT BEDTIME (Patient not taking: Reported on 01/17/2021)   No current facility-administered medications on file prior to visit.    Allergies:  No Known Allergies  Social History:  Social History   Socioeconomic History   Marital status: Married    Spouse name: Not on file   Number of children: Not on file   Years of education: Not on file   Highest education  level: Not on file  Occupational History   Not on file  Tobacco Use   Smoking status: Former    Years: 3.00    Types: Cigarettes    Quit date: 06/10/1989    Years since quitting: 31.6   Smokeless tobacco: Never  Vaping Use   Vaping Use: Never used  Substance and Sexual Activity   Alcohol use: Yes    Alcohol/week: 0.0 standard drinks   Drug use: No   Sexual activity: Yes    Partners: Female  Other Topics Concern   Not on file  Social History Narrative   Not on file   Social Determinants of Health   Financial Resource Strain: Low Risk    Difficulty of Paying Living Expenses: Not hard at all  Food Insecurity: No Food Insecurity   Worried About Programme researcher, broadcasting/film/videounning Out of Food in the Last Year: Never true   Ran Out of  Food in the Last Year: Never true  Transportation Needs: No Transportation Needs   Lack of Transportation (Medical): No   Lack of Transportation (Non-Medical): No  Physical Activity: Insufficiently Active   Days of Exercise per Week: 3 days   Minutes of Exercise per Session: 20 min  Stress: No Stress Concern Present   Feeling of Stress : Not at all  Social Connections: Socially Integrated   Frequency of Communication with Friends and Family: More than three times a week   Frequency of Social Gatherings with Friends and Family: More than three times a week   Attends Religious Services: More than 4 times per year   Active Member of [employer]ubs or Organizations: Yes   Attends Engineer, structuralClub or Organization Meetings: More than 4 times per year   Marital Status: Married  Catering managerntimate Partner Violence: Not At Risk   Fear of Current or Ex-Partner: No   Emotionally Abused: No   Physically Abused: No   Sexually Abused: No   Social History   Tobacco Use  Smoking Status Former   Years: 3.00   Types: Cigarettes   Quit date: 06/10/1989   Years since quitting: 31.6  Smokeless Tobacco Never   Social History   Substance and Sexual Activity  Alcohol Use Yes   Alcohol/week: 0.0 standard drinks    Family History:  Family History  Problem Relation Age of Onset   Kidney disease Mother        Dialysis   Cancer Mother        Breast   Diabetes Father    Cancer Father [phone]       Prostate   Gout Sister     Past medical history, surgical history, medications, allergies, family history and social history reviewed with patient today and changes made to appropriate areas of the chart.   Review of Systems - Negative except back pain which is manageable if doing exercises All other ROS negative except what is listed above and in the HPI.      Objective:    BP 124/80   Pulse 78   Temp 98.1 F (36.7 C) (Oral)   Resp 18   Ht 6\' 4"  (1.93 m)   Wt 229 lb 11.2 oz (104.2 kg)   SpO2 98%   BMI 27.96 kg/m   Wt  Readings from Last 3 Encounters:  01/17/21 229 lb 11.2 oz (104.2 kg)  07/28/20 227 lb 1.2 oz (103 kg)  06/16/20 227 lb (103 kg)    Physical Exam  Cognitive Testing - 6-CIT  Correct? Score   What year  is it? yes 0 Yes = 0    No = 4  What month is it? yes 0 Yes = 0    No = 3  Remember:     [place] [address], [address], [place]     What time is it? yes 0 Yes = 0    No = 3  Count backwards from 20 to 1 yes 0 Correct = 0    1 error = 2   More than 1 error = 4  Say the months of the year in reverse. yes 0 Correct = 0    1 error = 2   More than 1 error = 4  What address did I ask you to remember? yes 0 Correct = 0  1 error = 2    2 error = 4    3 error = 6    4 error = 8    All wrong = 10       TOTAL SCORE  0/28   Interpretation:  Normal  Normal (0-7) Abnormal (8-28)    Results for orders placed or performed during the hospital encounter of 06/16/20  SARS CORONAVIRUS 2 (TAT 6-24 HRS) Nasopharyngeal Nasopharyngeal Swab   Specimen: Nasopharyngeal Swab  Result Value Ref Range   SARS Coronavirus 2 NEGATIVE NEGATIVE      Assessment & Plan:   Problem List Items Addressed This Visit       Unprioritized   Advance care planning    A voluntary discussion about advance care planning including the explanation and discussion of advance directives was extensively discussed  with the patient.  Explanation about the health care proxy and Living will was reviewed.  During this discussion, the patient was able to identify a health care proxy as his wife and oldest daughter as a secondary and plans to fill out the paperwork required.  Patient was offered a separate Advance Care Planning visit for further assistance with forms.  Time spent:   20     Individuals present:  Patient       Elevated PSA, less than 10 ng/ml   Relevant Orders   PSA   Long term current use of anticoagulant therapy   Relevant Orders   CBC with Differential/Platelet   Other Visit Diagnoses     Need for influenza  vaccination    -  Primary   Need for immunization against influenza       Relevant Orders   Flu Vaccine QUAD High Dose(Fluad) (Completed)   Welcome to Medicare preventive visit       Relevant Orders   COMPLETE METABOLIC PANEL WITH GFR   TSH   Lipid panel        Preventative Services:  Health Risk Assessment and Personalized Prevention Plan: CVD Screening: Check cholesterol today The ASCVD Risk score Denman George DC Jr., et al., 2013) failed to calculate for the following reasons:   Cannot find a previous HDL lab   Cannot find a previous total cholesterol lab  Colon Cancer Screening: Getting FIT yearly at the [place] Depression Screening: done Glaucoma Screening: Sees eye specialist and planning on going Hepatitis B vaccine:N/A Hepatitis C screening: Completed  HIV Screening:Do today Flu Vaccine:done today Lung cancer Screening:N/A Obesity Screening: N/A Pneumonia Vaccines (2): STI Screening: N/A PSA screening: Getting screeing every 6 months   LABORATORY TESTING:  Health maintenance labs ordered today as discussed above.   The natural history of prostate cancer and ongoing controversy regarding screening and potential treatment  outcomes of prostate cancer has been discussed with the patient. The meaning of a false positive PSA and a false negative PSA has been discussed. He indicates understanding of the limitations of this screening test and wishes  to proceed with screening PSA testing.   IMMUNIZATIONS:   - Tdap: Tetanus vaccination status reviewed: UTD - Influenza: Administered today - Pneumovax: Given elsewhere - Prevnar: Given elsewhere - Zostavax vaccine: Given elsewhere  SCREENING: - Yearly Fit testing - AAA Screening: Not applicable  -Hearing Test: Not applicable  -Spirometry: Not applicable   PATIENT COUNSELING:    Sexuality: Discussed sexually transmitted diseases, partner selection, use of condoms, avoidance of unintended pregnancy  and contraceptive  alternatives.   Advised to avoid cigarette smoking.  I discussed with the patient that most people either abstain from alcohol or drink within safe limits (<=14/week and <=4 drinks/occasion for males, <=7/weeks and <= 3 drinks/occasion for females) and that the risk for alcohol disorders and other health effects rises proportionally with the number of drinks per week and how often a drinker exceeds daily limits.  Discussed cessation/primary prevention of drug use and availability of treatment for abuse.   Diet: Encouraged to adjust caloric intake to maintain  or achieve ideal body weight, to reduce intake of dietary saturated fat and total fat, to limit sodium intake by avoiding high sodium foods and not adding table salt, and to maintain adequate dietary potassium and calcium preferably from fresh fruits, vegetables, and low-fat dairy products.    stressed the importance of regular exercise  Injury prevention: Discussed safety belts, safety helmets, smoke detector, smoking near bedding or upholstery.   Dental health: Discussed importance of regular tooth brushing, flossing, and dental visits.   Follow up plan: NEXT PREVENTATIVE PHYSICAL DUE IN 1 YEAR. Return in about 1 year (around 01/17/2022).

## 2021-01-18 LAB — CBC WITH DIFFERENTIAL/PLATELET
Absolute Monocytes: 221 cells/uL (ref 200–950)
Basophils Absolute: 20 cells/uL (ref 0–200)
Basophils Relative: 0.6 %
Eosinophils Absolute: 51 cells/uL (ref 15–500)
Eosinophils Relative: 1.5 %
HCT: 44.2 % (ref 38.5–50.0)
Hemoglobin: 14.8 g/dL (ref 13.2–17.1)
Lymphs Abs: 1316 cells/uL (ref 850–3900)
MCH: 29.3 pg (ref 27.0–33.0)
MCHC: 33.5 g/dL (ref 32.0–36.0)
MCV: 87.5 fL (ref 80.0–100.0)
MPV: 10.7 fL (ref 7.5–12.5)
Monocytes Relative: 6.5 %
Neutro Abs: 1792 cells/uL (ref 1500–7800)
Neutrophils Relative %: 52.7 %
Platelets: 198 10*3/uL (ref 140–400)
RBC: 5.05 10*6/uL (ref 4.20–5.80)
RDW: 13.3 % (ref 11.0–15.0)
Total Lymphocyte: 38.7 %
WBC: 3.4 10*3/uL — ABNORMAL LOW (ref 3.8–10.8)

## 2021-01-18 LAB — LIPID PANEL
Cholesterol: 173 mg/dL (ref <200)
HDL: 59 mg/dL (ref >=40)
LDL Cholesterol (Calc): 96 mg/dL (calc)
Non-HDL Cholesterol (Calc): 114 mg/dL (calc) (ref <130)
Total CHOL/HDL Ratio: 2.9 (calc) (ref <5.0)
Triglycerides: 87 mg/dL (ref <150)

## 2021-01-18 LAB — COMPLETE METABOLIC PANEL WITH GFR
AG Ratio: 2 (calc) (ref 1.0–2.5)
ALT: 22 U/L (ref 9–46)
AST: 22 U/L (ref 10–35)
Albumin: 4.8 g/dL (ref 3.6–5.1)
Alkaline phosphatase (APISO): 53 U/L (ref 35–144)
BUN/Creatinine Ratio: 10 (calc) (ref 6–22)
BUN: 14 mg/dL (ref 7–25)
CO2: 28 mmol/L (ref 20–32)
Calcium: 9.8 mg/dL (ref 8.6–10.3)
Chloride: 103 mmol/L (ref 98–110)
Creat: 1.41 mg/dL — ABNORMAL HIGH (ref 0.70–1.35)
Globulin: 2.4 g/dL (calc) (ref 1.9–3.7)
Glucose, Bld: 88 mg/dL (ref 65–99)
Potassium: 4.5 mmol/L (ref 3.5–5.3)
Sodium: 139 mmol/L (ref 135–146)
Total Bilirubin: 0.9 mg/dL (ref 0.2–1.2)
Total Protein: 7.2 g/dL (ref 6.1–8.1)
eGFR: 55 mL/min/{1.73_m2} — ABNORMAL LOW (ref >=60)

## 2021-01-18 LAB — PSA: PSA: 2.08 ng/mL (ref <=4.00)

## 2021-01-18 LAB — TSH: TSH: 1.83 mIU/L (ref 0.40–4.50)

## 2021-03-18 ENCOUNTER — Other Ambulatory Visit: Payer: Self-pay

## 2021-03-18 ENCOUNTER — Ambulatory Visit (INDEPENDENT_AMBULATORY_CARE_PROVIDER_SITE_OTHER): Payer: Medicare Other

## 2021-03-18 DIAGNOSIS — L03818 Cellulitis of other sites: Secondary | ICD-10-CM | POA: Insufficient documentation

## 2021-03-18 DIAGNOSIS — Z789 Other specified health status: Secondary | ICD-10-CM | POA: Insufficient documentation

## 2021-03-18 DIAGNOSIS — Z8739 Personal history of other diseases of the musculoskeletal system and connective tissue: Secondary | ICD-10-CM | POA: Insufficient documentation

## 2021-03-18 DIAGNOSIS — I82591 Chronic embolism and thrombosis of other specified deep vein of right lower extremity: Secondary | ICD-10-CM | POA: Insufficient documentation

## 2021-03-18 DIAGNOSIS — Z23 Encounter for immunization: Secondary | ICD-10-CM

## 2021-03-18 DIAGNOSIS — H00013 Hordeolum externum right eye, unspecified eyelid: Secondary | ICD-10-CM | POA: Insufficient documentation

## 2021-03-18 DIAGNOSIS — I82409 Acute embolism and thrombosis of unspecified deep veins of unspecified lower extremity: Secondary | ICD-10-CM | POA: Insufficient documentation

## 2021-03-18 NOTE — Progress Notes (Signed)
Patient tolerated flu vaccine well with no adverse reaction immediatly noted. Patient agreed to call us or report to the nearest emergency facility with any questions, concerns or adverse reaction should they arise.

## 2021-06-09 ENCOUNTER — Other Ambulatory Visit: Payer: Self-pay

## 2021-06-09 ENCOUNTER — Ambulatory Visit
Admission: EM | Admit: 2021-06-09 | Discharge: 2021-06-09 | Disposition: A | Discharge status: Home / Self Care | Payer: Non-veteran care

## 2021-06-09 ENCOUNTER — Encounter: Payer: Self-pay | Admitting: Emergency Medicine

## 2021-06-09 DIAGNOSIS — M25532 Pain in left wrist: Secondary | ICD-10-CM

## 2021-06-09 MED ORDER — PREDNISONE 10 MG (21) PO TBPK
ORAL_TABLET | ORAL | 0 refills | Status: DC
Start: 2021-06-09 — End: 2024-01-21

## 2021-06-09 NOTE — ED Provider Notes (Addendum)
[facility]    CSN: [id number] Arrival date & time: 06/09/21  1456      History   Chief Complaint Chief Complaint  Patient presents with   Wrist Pain    left    HPI Nathaniel [name] is a 66 y.o. male.   HPI  45 old male here for evaluation of left wrist and hand pain.  Patient reports that he developed some pain in his left wrist approximately 2 weeks ago that resolved after couple days and then returned a little more than a week ago.  He states that the pain extends down into his hand.  [facility] and placed in a wrist brace.  He states after wearing the wrist brace his pain dramatically increased and now he cannot close his hand or move his wrist secondary to pain.  He denies any precipitating events or numbness or tingling in his fingers or hand.  He has had 1 previous episode similar to this a year ago that responded to prednisone.  Past Medical History:  Diagnosis Date   CKD (chronic kidney disease) stage 2, GFR 60-89 ml/min    Gout    History of DVT (deep vein thrombosis)    DVT right lower extremity in 2003 on coumadin for 3 months, INR levels subtherapeutic, started having slurred words and memory issues, extensive TIA/CVA work up done, no residual deficits. Switched to asa plus plavix ever since and has done well thus far.   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 ([place]) 03/18/2021   Health maintenance alteration 03/18/2021   History of gout 03/18/2021   Hordeolum externum right eye, unspecified eyelid  03/18/2021   Recurrent deep vein thrombosis ([place]) 03/18/2021   Long term current use of anticoagulant therapy 01/16/2017   Gout of right foot 09/24/2016   Foraminal stenosis of lumbar region 02/26/2016   Degenerative disc disease, lumbar 02/26/2016   Deep venous thrombosis of right tibial vein ([place]) 02/21/2016   Elevated blood uric acid level 11/20/2015   Elevated PSA, less than 10 ng/ml 07/06/2015   Advance care planning 07/06/2015   Lumbar back pain 07/06/2015   Hematospermia 06/11/2015   CKD (chronic kidney disease) stage 2, GFR 60-89 ml/min 06/06/2015   Prediabetes 06/06/2015   History of TIA (transient ischemic attack) 06/06/2015   History of elevated PSA    History of DVT (deep vein thrombosis)     Past Surgical History:  Procedure Laterality Date   BIOPSY PROSTATE  ?2012   neg   COLONOSCOPY  2007   in [place] Medications    Prior to Admission medications   Medication Sig Start Date End Date Taking? Authorizing Provider  aspirin 81 MG EC tablet Take 1 tablet by mouth daily. 02/15/18  Yes [provider]  atorvastatin (LIPITOR) 20 MG tablet TAKE ONE TABLET BY MOUTH DAILY FOR CHOLESTEROL 04/16/21  Yes [provider]  ELIQUIS 5 MG TABS tablet TAKE 1 TABLET BY MOUTH TWICE DAILY  07/05/18  Yes Corcoran, Drue Second, MD  predniSONE (STERAPRED UNI-PAK 21 TAB) 10 MG (21) TBPK tablet Take 6 tablets on day 1, 5 tablets day 2, 4 tablets day 3, 3 tablets day 4, 2 tablets day 5, 1 tablet day 6 06/09/21  Yes Margarette Canada, NP    Family History Family History  Problem Relation Age of Onset   Kidney disease Mother        Dialysis   Cancer Mother        Breast   Diabetes Father    Cancer Father 66       Prostate   Gout Sister     Social History Social History   Tobacco Use   Smoking status: Former    Years: 3.00    Types: Cigarettes    Quit date: 06/10/1989    Years since quitting: 32.0   Smokeless tobacco: Never  Vaping Use   Vaping Use: Never  used  Substance Use Topics   Alcohol use: Yes    Alcohol/week: 0.0 standard drinks   Drug use: No     Allergies   Patient has no known allergies.   Review of Systems Review of Systems  Musculoskeletal:  Positive for arthralgias and joint swelling.  Skin:  Negative for color change.  Neurological:  Positive for weakness. Negative for numbness.  Hematological: Negative.   Psychiatric/Behavioral: Negative.      Physical Exam Triage Vital Signs ED Triage Vitals  Enc Vitals Group     BP 06/09/21 1512 117/77     Pulse Rate 06/09/21 1512 91     Resp 06/09/21 1512 15     Temp 06/09/21 1512 98.7 F (37.1 C)     Temp Source 06/09/21 1512 Oral     SpO2 06/09/21 1512 98 %     Weight 06/09/21 1510 225 lb (102.1 kg)     Height 06/09/21 1510 6\' 4"  (1.93 m)     Head Circumference --      Peak Flow --      Pain Score 06/09/21 1509 10     Pain Loc --      Pain Edu? --      Excl. in [place]? --    No data found.  Updated Vital Signs BP 117/77 (BP Location: Right Arm)    Pulse 91    Temp 98.7 F (37.1 C) (Oral)    Resp 15    Ht 6\' 4"  (1.93 m)    Wt 225 lb (102.1 kg)    SpO2 98%    BMI 27.39 kg/m   Visual Acuity Right Eye Distance:   Left Eye Distance:   Bilateral Distance:    Right Eye Near:   Left Eye Near:    Bilateral Near:     Physical Exam Vitals and nursing note reviewed.  Constitutional:      General: He is not in acute distress.    Appearance: Normal appearance. He is normal weight. He is not ill-appearing.  HENT:     Head: Normocephalic and atraumatic.  Musculoskeletal:        General: Swelling and tenderness present. No deformity.     Comments: Limited range of motion of wrist and fingers of left hand due to pain.  Skin:    General: Skin is warm and dry.     Capillary Refill: Capillary refill takes less than 2 seconds.     Findings: No erythema.  Neurological:     General: No focal deficit present.  Mental Status: He is alert and oriented to person, place,  and time.  Psychiatric:        Mood and Affect: Mood normal.        Behavior: Behavior normal.        Thought Content: Thought content normal.        Judgment: Judgment normal.     UC Treatments / Results  Labs (all labs ordered are listed, but only abnormal results are displayed) Labs Reviewed - No data to display  EKG   Radiology No results found.  Procedures Procedures (including critical care time)  Medications Ordered in UC Medications - No data to display  Initial Impression / Assessment and Plan / UC Course  I have reviewed the triage vital signs and the nursing notes.  Pertinent labs & imaging results that were available during my care of the patient were reviewed by me and considered in my medical decision making (see chart for details).  Patient is a very pleasant, nontoxic-appearing 66 year old male here for evaluation of left wrist and hand pain has been going on for over a week.  He has had this previously and states that responded to steroids.  [facility] and was placed in a wrist brace.  The wrist brace made his symptoms worse.  On exam patient has left hand and wrist that are normal anatomical position.  He has full sensation in all of his fingers.  There is no pain with palpation of the phalanges or metacarpals.  Also no pain with compression of the radial and ulnar styloids.  When palpating over the carpal bones patient does have some discomfort.  This area is where the swelling is and is also hot to touch.  No appreciable overlying erythema is noted.  Patient does have a history of gout but is not taking any Medication.  He recently had a physical and his uric acid level was normal but in the interim he has started taking a supplement that contains green vegetables.  He is also been eating some leafy green vegetables in his diet.  I suspect that the patient may have gout in his wrist and I will treat him with a steroid pack.  I  have also advised him to not wear the splint anymore since it has caused a decrease in his range of motion due to an increase in pain.  I have encouraged him to progress through range of motion to try and regain mobility in his wrist.  He may also apply ice or moist heat to his wrist for 20 minutes at a time 2-3 times a day to help with pain levels.  I have suggested that he follow-up with his PCP as well to have his uric acid levels redrawn.   Final Clinical Impressions(s) / UC Diagnoses   Final diagnoses:  Left wrist pain     Discharge Instructions      Take the prednisone pack according to the package insert.  Increase your water intake to help flush your system.  Apply ice or moist heat to your wrist for 20 minutes at a time to help with pain.  Follow-up with your PCP after you have finished steroids to have your uric acid levels checked.      ED Prescriptions     Medication Sig Dispense Auth. Provider   predniSONE (STERAPRED UNI-PAK 21 TAB) 10 MG (21) TBPK tablet Take 6 tablets on day 1, 5 tablets day  2, 4 tablets day 3, 3 tablets day 4, 2 tablets day 5, 1 tablet day 6 21 tablet Margarette Canada, NP      PDMP not reviewed this encounter.   Margarette Canada, NP 06/09/21 1537    Margarette Canada, NP 06/09/21 1537

## 2021-06-09 NOTE — ED Triage Notes (Signed)
Patient c/o left wrist and hand pain that has bee going on for over a week.  Patient states that over the past 2 days his pain has gotten worse.  Patient denies recently injuring it.

## 2021-06-09 NOTE — Discharge Instructions (Signed)
Take the prednisone pack according to the package insert.  Increase your water intake to help flush your system.  Apply ice or moist heat to your wrist for 20 minutes at a time to help with pain.  Follow-up with your PCP after you have finished steroids to have your uric acid levels checked.

## 2022-04-16 ENCOUNTER — Ambulatory Visit
Admission: RE | Admit: 2022-04-16 | Discharge: 2022-04-16 | Disposition: A | Discharge status: Home / Self Care | Payer: No Typology Code available for payment source | Source: Ambulatory Visit | Attending: Physician Assistant | Admitting: Physician Assistant

## 2022-04-16 ENCOUNTER — Other Ambulatory Visit: Payer: Self-pay | Admitting: Physician Assistant

## 2022-04-16 DIAGNOSIS — S92115A Nondisplaced fracture of neck of left talus, initial encounter for closed fracture: Secondary | ICD-10-CM | POA: Diagnosis present

## 2022-05-05 IMAGING — CR DG WRIST COMPLETE 3+V*L*
4 series · 4 of 4 positions shown · non-contrast
Comparison: None.

CLINICAL DATA: Pain and swelling after doing pushups today. Pain at
scaphoid.

EXAM:
LEFT WRIST - COMPLETE 3+ VIEW

[wrist pa]
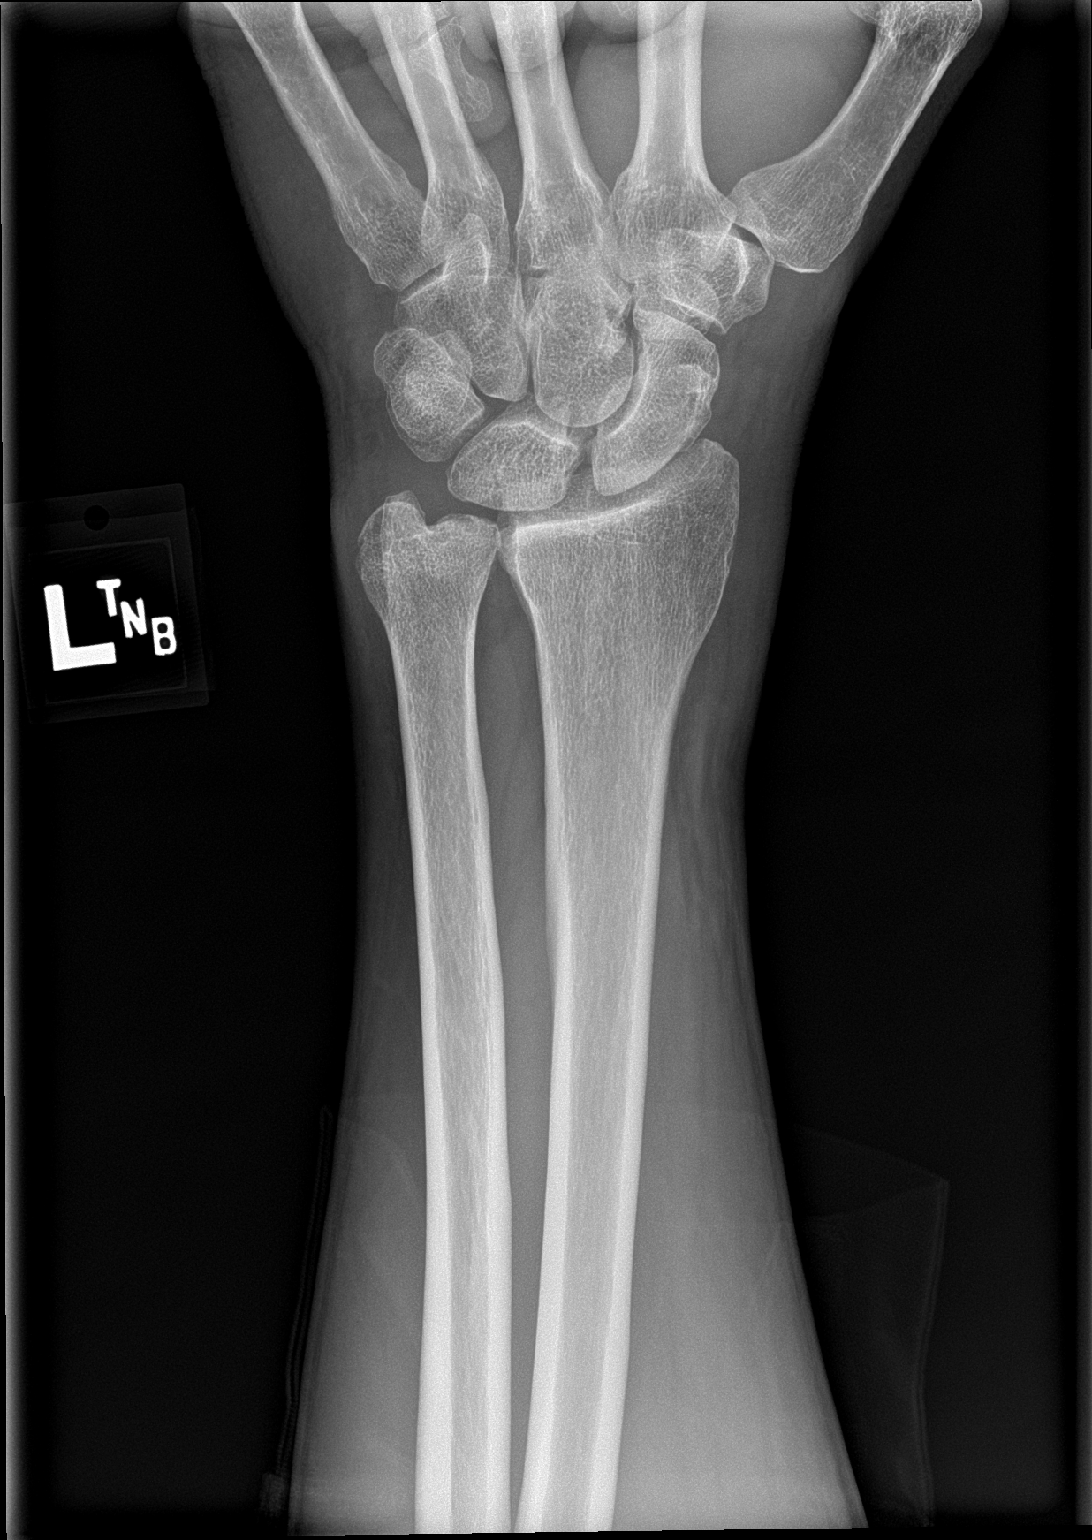

[wrist obl]
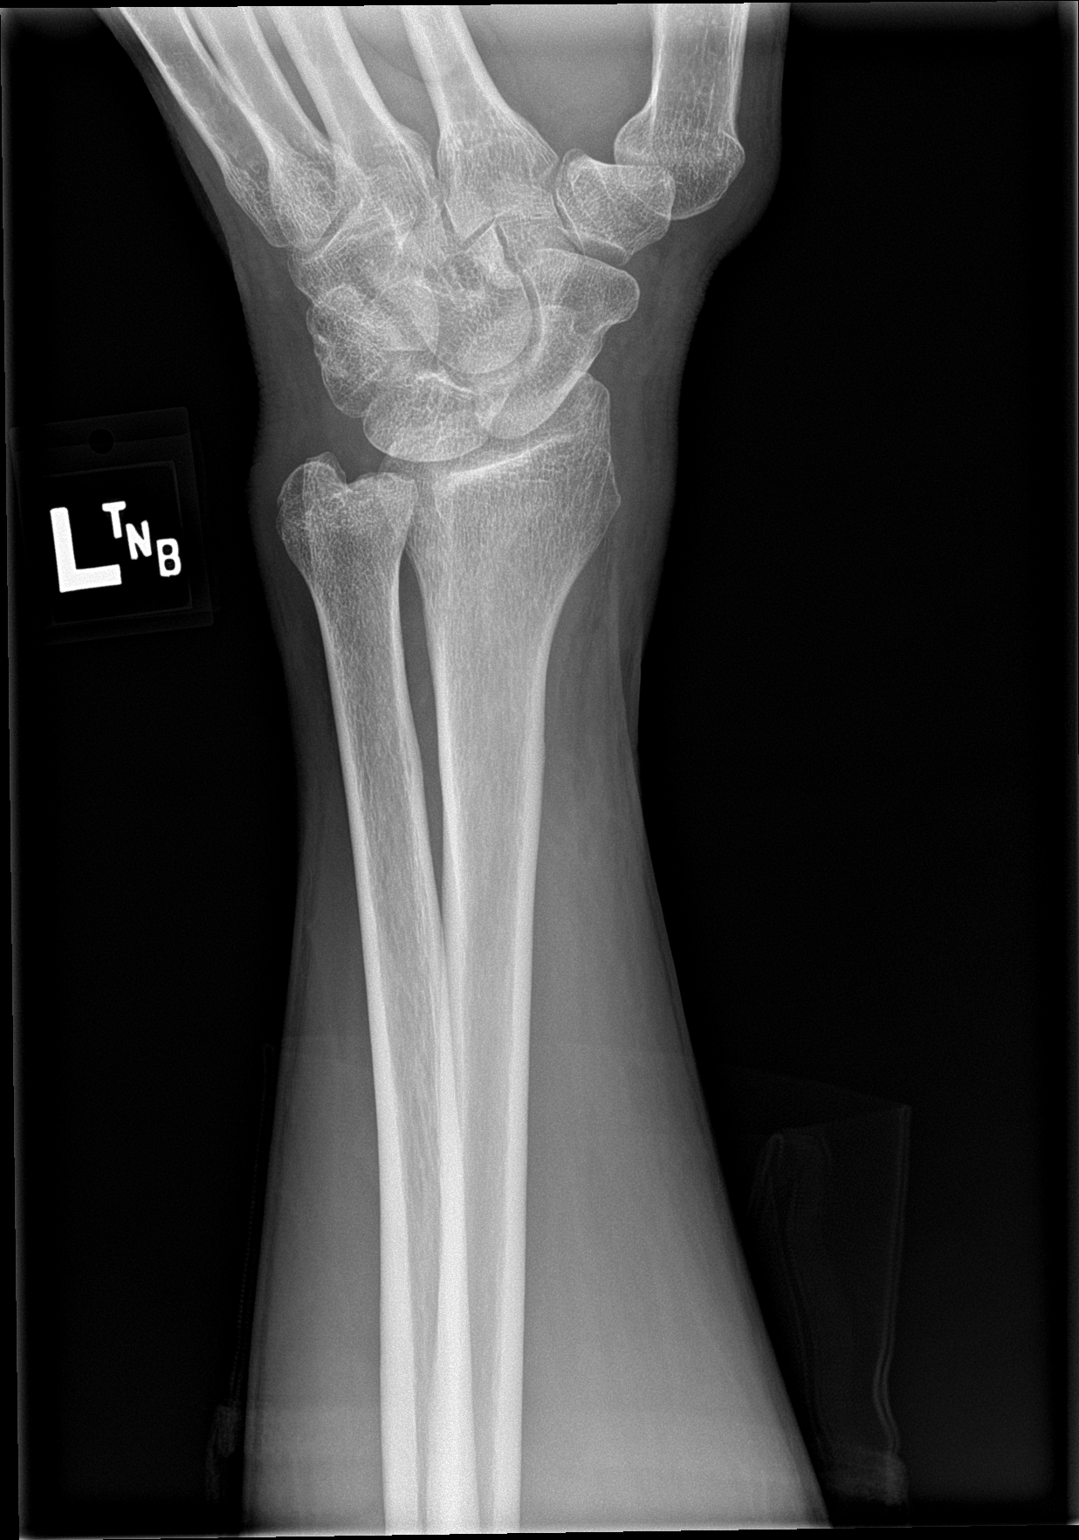

[wrist lat]
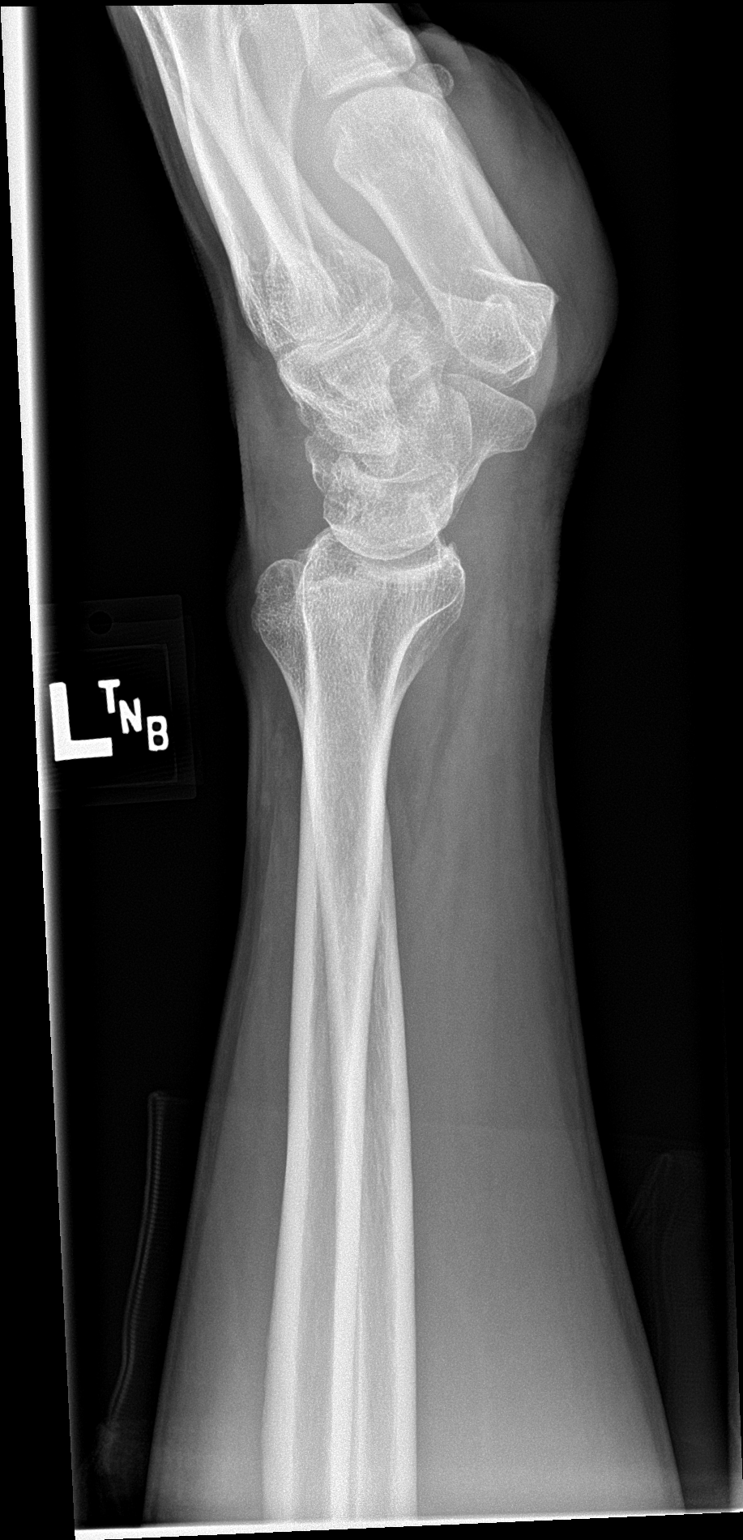

[wrist navicular]
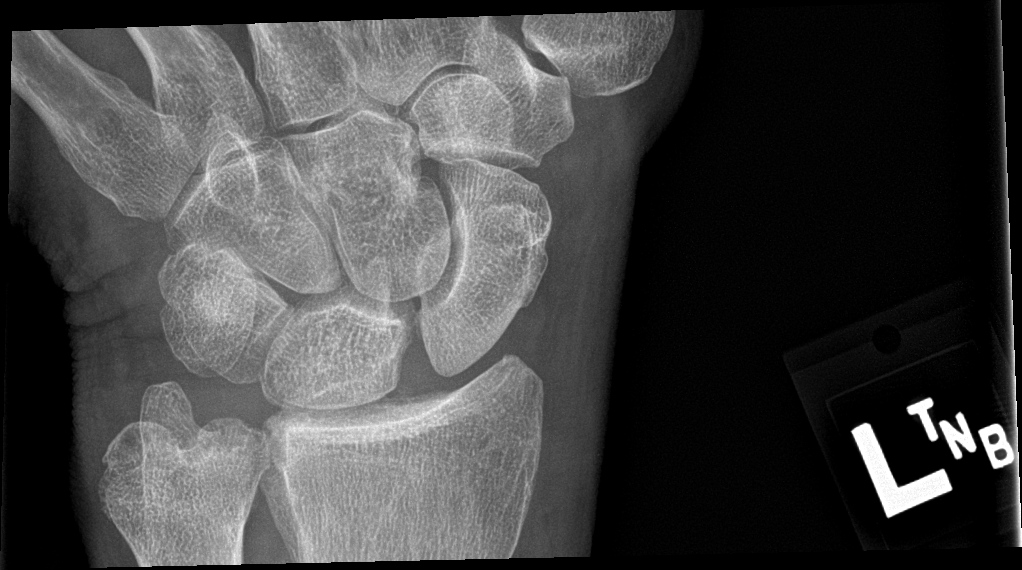

[4 of 4 positions shown; findings below may reference images not displayed]

FINDINGS: There is no evidence of fracture or dislocation. There is no
evidence of arthropathy or other focal bone abnormality. Soft
tissues are unremarkable.
IMPRESSION: Negative.

## 2022-07-29 ENCOUNTER — Telehealth: Payer: Self-pay | Admitting: Nurse Practitioner

## 2022-07-29 NOTE — Telephone Encounter (Signed)
Called patient to schedule Medicare Annual Wellness Visit (AWV). Left message for patient to call back and schedule Medicare Annual Wellness Visit (AWV).  Last date of AWV: 01/15/2021  Please schedule an appointment at any time with [facility].  If any questions, please contact me at [phone].  Thank you ,  [place] Direct Dial: [phone]

## 2023-01-15 ENCOUNTER — Ambulatory Visit (INDEPENDENT_AMBULATORY_CARE_PROVIDER_SITE_OTHER): Payer: Self-pay

## 2023-01-15 VITALS — Ht 76.0 in | Wt 208.0 lb

## 2023-01-15 DIAGNOSIS — Z0000 Encounter for general adult medical examination without abnormal findings: Secondary | ICD-10-CM

## 2023-01-15 NOTE — Progress Notes (Signed)
Subjective:   Nathaniel [name] is a 67 y.o. male who presents for an Initial Medicare Annual Wellness Visit.  Visit Complete: Virtual  I connected with  Engel Petrossian on 01/15/23 by a audio enabled telemedicine application and verified that I am speaking with the correct person using two identifiers.  Patient Location: Home  Provider Location: Office/Clinic  I discussed the limitations of evaluation and management by telemedicine. The patient expressed understanding and agreed to proceed.  Vital Signs: Unable to obtain new vitals due to this being a telehealth visit.  Patient Medicare AWV questionnaire was completed by the patient on (not done); I have confirmed that all information answered by patient is correct and no changes since this date.  Review of Systems    Cardiac Risk Factors include: advanced age (>57men, >65 women);male gender    Objective:    Today's Vitals   01/15/23 1022 01/15/23 1024  Weight: 208 lb (94.3 kg)   Height: 6\' 4"  (1.93 m)   PainSc:  5    Body mass index is 25.32 kg/m.     01/15/2023   10:33 AM 06/09/2021    3:11 PM 07/28/2020    1:19 PM 06/16/2020    2:30 PM 04/11/2020   10:17 AM 03/22/2018    3:14 PM 07/31/2017    3:18 PM  Advanced Directives  Does Patient Have a Medical Advance Directive? Yes No No No No No No  Type of Estate agent of [place];Living will        Would patient like information on creating a medical advance directive?      No - Patient declined     Current Medications (verified) Outpatient Encounter Medications as of 01/15/2023  Medication Sig   aspirin 81 MG EC tablet Take 1 tablet by mouth daily.   atorvastatin (LIPITOR) 20 MG tablet TAKE ONE TABLET BY MOUTH DAILY FOR CHOLESTEROL   ELIQUIS 5 MG TABS tablet TAKE 1 TABLET BY MOUTH TWICE DAILY   predniSONE (STERAPRED UNI-PAK 21 TAB) 10 MG (21) TBPK tablet Take 6 tablets on day 1, 5 tablets day 2, 4 tablets day 3, 3 tablets day 4, 2 tablets day 5, 1 tablet day  6 (Patient not taking: Reported on 01/15/2023)   No facility-administered encounter medications on file as of 01/15/2023.    Allergies (verified) Patient has no known allergies.   History: Past Medical History:  Diagnosis Date   CKD (chronic kidney disease) stage 2, GFR 60-89 ml/min    Gout    History of DVT (deep vein thrombosis)    DVT right lower extremity in 2003 on coumadin for 3 months, INR levels subtherapeutic, started having slurred words and memory issues, extensive TIA/CVA work up done, no residual deficits. Switched to asa plus plavix ever since and has done well thus far.   History of elevated PSA    Has seen Urologist in [place]: Prostate Biopsy Benign (Specialist wanted to do biopsies q6 months but patient did not think this was necessary and would rather monitor with annual PSA's)   History of TIA (transient ischemic attack)    Pre-diabetes    Past Surgical History:  Procedure Laterality Date   BIOPSY PROSTATE  ?2012   neg   COLONOSCOPY  2007   in [place]   Family History  Problem Relation Age of Onset   Kidney disease Mother        Dialysis   Cancer Mother        Breast   Diabetes  Father    Cancer Father 65       Prostate   Gout Sister    Social History   Socioeconomic History   Marital status: Married    Spouse name: Not on file   Number of children: Not on file   Years of education: Not on file   Highest education level: Not on file  Occupational History   Not on file  Tobacco Use   Smoking status: Former    Current packs/day: 0.00    Types: Cigarettes    Start date: 06/10/1986    Quit date: 06/10/1989    Years since quitting: 33.6   Smokeless tobacco: Never  Vaping Use   Vaping status: Never Used  Substance and Sexual Activity   Alcohol use: Yes    Alcohol/week: 0.0 standard drinks of alcohol   Drug use: No   Sexual activity: Yes    Partners: Female  Other Topics Concern   Not on file  Social History Narrative   Not on file   Social  Determinants of Health   Financial Resource Strain: Low Risk  (01/15/2023)   Overall Financial Resource Strain (CARDIA)    Difficulty of Paying Living Expenses: Not hard at all  Food Insecurity: No Food Insecurity (01/15/2023)   Hunger Vital Sign    Worried About Running Out of Food in the Last Year: Never true    Ran Out of Food in the Last Year: Never true  Transportation Needs: No Transportation Needs (01/15/2023)   PRAPARE - Administrator, Civil Service (Medical): No    Lack of Transportation (Non-Medical): No  Physical Activity: Insufficiently Active (01/15/2023)   Exercise Vital Sign    Days of Exercise per Week: 3 days    Minutes of Exercise per Session: 20 min  Stress: No Stress Concern Present (01/15/2023)   [employer] of Occupational Health - Occupational Stress Questionnaire    Feeling of Stress : Not at all  Social Connections: Socially Integrated (01/15/2023)   Social Connection and Isolation Panel [NHANES]    Frequency of Communication with Friends and Family: More than three times a week    Frequency of Social Gatherings with Friends and Family: More than three times a week    Attends Religious Services: More than 4 times per year    Active Member of [employer] or Organizations: Yes    Attends Engineer, structural: More than 4 times per year    Marital Status: Married    Tobacco Counseling Counseling given: Not Answered  Clinical Intake:  Pre-visit preparation completed: Yes  Pain : 0-10 Pain Score: 5  Pain Type: Chronic pain Pain Location: Back Pain Orientation: Lower Pain Descriptors / Indicators: Aching Pain Onset: More than a month ago Pain Frequency: Intermittent Pain Relieving Factors: Vitamin/exercise/ice every now and them  Pain Relieving Factors: Vitamin/exercise/ice every now and them  BMI - recorded: 27.39 Nutritional Status: BMI 25 -29 Overweight Nutritional Risks: None Diabetes: No  How often do you need to have  someone help you when you read instructions, pamphlets, or other written materials from your doctor or pharmacy?: 1 - Never  Interpreter Needed?: No  Comments: others live with him Information entered by :: B.Eithan Beagle,LPN   Activities of Daily Living    01/15/2023   10:35 AM  In your present state of health, do you have any difficulty performing the following activities:  Hearing? 0  Vision? 0  Difficulty concentrating or making decisions? 0  Walking or  climbing stairs? 0  Dressing or bathing? 0  Doing errands, shopping? 0  Preparing Food and eating ? N  Using the Toilet? N  In the past six months, have you accidently leaked urine? N  Do you have problems with loss of bowel control? N  Managing your Medications? N  Managing your Finances? N  Housekeeping or managing your Housekeeping? N    Patient Care Team: Berniece Salines, FNP as PCP - General (Nurse Practitioner) Kerman Passey, MD as Attending Physician (Family Medicine) Kieth Brightly, MD (General Surgery) Rosey Bath, MD (Inactive) as Referring Physician (Hematology and Oncology)  Indicate any recent Medical Services you may have received from other than [facility] providers in the past year (date may be approximate).     Assessment:   This is a routine wellness examination for [place].  Hearing/Vision screen Hearing Screening - Comments:: Adequate hearing Vision Screening - Comments:: Adequate vision w/glasses [facility] [place]   Dietary issues and exercise activities discussed:     Goals Addressed             This Visit's Progress    Patient Stated       Pt would like lift weights increase your strength and stamina. Continue to stay healthy and regular blood check ups       Depression Screen    01/15/2023   10:31 AM 01/17/2021    3:31 PM 04/13/2018   11:31 AM 09/24/2016    8:58 AM 02/21/2016    2:39 PM 11/12/2015    9:42 AM 10/16/2015    4:10 PM  PHQ 2/9 Scores  PHQ - 2 Score 0 0  0 0 0 0 0    Fall Risk    01/15/2023   10:27 AM 01/17/2021    3:31 PM 04/13/2018   11:32 AM 09/24/2016    8:58 AM 02/21/2016    2:39 PM  Fall Risk   Falls in the past year? 1 0 0 No No  Number falls in past yr: 0 0     Injury with Fall? 0 0     Risk for fall due to : Orthopedic patient      Follow up Education provided;Falls prevention discussed Falls evaluation completed       MEDICARE RISK AT HOME: Medicare Risk at Home Any stairs in or around the home?: Yes If so, are there any without handrails?: Yes Home free of loose throw rugs in walkways, pet beds, electrical cords, etc?: Yes Adequate lighting in your home to reduce risk of falls?: Yes Life alert?: No Use of a cane, walker or w/c?: No Grab bars in the bathroom?: Yes Shower chair or bench in shower?: No Elevated toilet seat or a handicapped toilet?: No  TIMED UP AND GO:  Was the test performed? No    Cognitive Function:        01/15/2023   10:38 AM  6CIT Screen  What Year? 0 points  What month? 0 points  What time? 0 points  Count back from 20 0 points  Months in reverse 0 points  Repeat phrase 0 points  Total Score 0 points    Immunizations Immunization History  Administered Date(s) Administered   Fluad Quad(high Dose 65+) 01/17/2021, 03/18/2021   Influenza,inj,Quad PF,6+ Mos 06/06/2015, 03/10/2016, 02/15/2018, 02/21/2019, 03/02/2020   Influenza-Unspecified 02/25/2018   Moderna SARS-COV2 Booster Vaccination 08/30/2020   Moderna Sars-Covid-2 Vaccination 08/05/2019, 09/06/2019, 10/03/2019   PPD Test 02/07/2015, 02/07/2015, 02/12/2015   Tdap 07/06/2015,  01/02/2017   Zoster Recombinant(Shingrix) 03/02/2020    TDAP status: Up to date  Flu Vaccine status: Up to date  Pneumococcal vaccine status: Up to date  Covid-19 vaccine status: Completed vaccines  Qualifies for Shingles Vaccine? Yes   Zostavax completed Yes  #1 Shingrix Completed?: No.    Education has been provided regarding the importance of  this vaccine. Patient has been advised to call insurance company to determine out of pocket expense if they have not yet received this vaccine. Advised may also receive vaccine at local pharmacy or Health Dept. Verbalized acceptance and understanding.  Screening Tests Health Maintenance  Topic Date Due   Pneumonia Vaccine 7+ Years old (1 of 1 - PCV) Never done   Fecal DNA (Cologuard)  07/08/2020   COVID-19 Vaccine (6 - 2023-24 season) 01/24/2022   INFLUENZA VACCINE  12/25/2022   Medicare Annual Wellness (AWV)  01/15/2024   DTaP/Tdap/Td (3 - Td or Tdap) 01/03/2027   Hepatitis C Screening  Completed   Zoster Vaccines- Shingrix  Completed   HPV VACCINES  Aged Out    Health Maintenance  Health Maintenance Due  Topic Date Due   Pneumonia Vaccine 33+ Years old (1 of 1 - PCV) Never done   Fecal DNA (Cologuard)  07/08/2020   COVID-19 Vaccine (6 - 2023-24 season) 01/24/2022   INFLUENZA VACCINE  12/25/2022    Colorectal cancer screening: Type of screening: Cologuard. Completed yes. Repeat every 3 years  Lung Cancer Screening: (Low Dose CT Chest recommended if Age 34-80 years, 20 pack-year currently smoking OR have quit w/in 15years.) does not qualify.   Lung Cancer Screening Referral: no  Additional Screening:  Hepatitis C Screening: does not qualify; Completed yes  Vision Screening: Recommended annual ophthalmology exams for early detection of glaucoma and other disorders of the eye. Is the patient up to date with their annual eye exam?  Yes  Who is the provider or what is the name of the [facility] in which the patient [facility] [place] If pt is not established with a provider, would they like to be referred to a provider to establish care? No .   Dental Screening: Recommended annual dental exams for proper oral hygiene  Diabetic Foot Exam: n/a  Community Resource Referral / Chronic Care Management: CRR required this visit?  No   CCM required this  visit?  No    Plan:     I have personally reviewed and noted the following in the patient's chart:   Medical and social history Use of alcohol, tobacco or illicit drugs  Current medications and supplements including opioid prescriptions. Patient is not currently taking opioid prescriptions. Functional ability and status Nutritional status Physical activity Advanced directives List of other physicians Hospitalizations, surgeries, and ER visits in previous 12 months Vitals Screenings to include cognitive, depression, and falls Referrals and appointments  In addition, I have reviewed and discussed with patient certain preventive protocols, quality metrics, and best practice recommendations. A written personalized care plan for preventive services as well as general preventive health recommendations were provided to patient.    Sue Lush, LPN   09/01/8117   After Visit Summary: (MyChart) Due to this being a telephonic visit, the after visit summary with patients personalized plan was offered to patient via MyChart   Nurse Notes: The patient states he is doing well and has no concerns or questions at this time.

## 2023-01-15 NOTE — Patient Instructions (Signed)
Nathaniel [name] , Thank you for taking time to come for your Medicare Wellness Visit. I appreciate your ongoing commitment to your health goals. Please review the following plan we discussed and let me know if I can assist you in the future.   Referrals/Orders/Follow-Ups/Clinician Recommendations: none  This is a list of the screening recommended for you and due dates:  Health Maintenance  Topic Date Due   Pneumonia Vaccine (1 of 1 - PCV) Never done   Cologuard (Stool DNA test)  07/08/2020   COVID-19 Vaccine (6 - 2023-24 season) 01/24/2022   Flu Shot  12/25/2022   Medicare Annual Wellness Visit  01/15/2024   DTaP/Tdap/Td vaccine (3 - Td or Tdap) 01/03/2027   Hepatitis C Screening  Completed   Zoster (Shingles) Vaccine  Completed   HPV Vaccine  Aged Out    Advanced directives: (Copy Requested) Please bring a copy of your health care power of attorney and living will to the office to be added to your chart at your convenience.  Next Medicare Annual Wellness Visit scheduled for next year: Yes 01/21/24 @ 11:15am telephone

## 2023-03-30 ENCOUNTER — Telehealth: Payer: Non-veteran care | Admitting: Physician Assistant

## 2023-03-30 DIAGNOSIS — R300 Dysuria: Secondary | ICD-10-CM

## 2023-03-30 NOTE — Progress Notes (Signed)
E-Visit for Urinary Problems  Based on what you shared with me, I feel your condition warrants further evaluation and I recommend that you be seen for a face to face office visit.  Male bladder infections are not very common.  We worry about prostate or kidney conditions.  The standard of care is to examine the abdomen and kidneys, and to do a urine and blood test to make sure that something more serious is not going on.  We recommend that you see a provider today.  If your doctor's office is closed [facility] has the following Urgent Cares:    NOTE: You will not be charged for this e-visit.  If you are having a true medical emergency please call 911.       For an urgent face to face visit, [facility] has six urgent care centers for your convenience:     [facility] [place] Get Driving Directions [phone] [address] [place], [place] [address]    [facility] ([place]) Get Driving Directions [phone] [address] [place], [place] [address]  [facility] ([place] [facility] [place]) Get Driving Directions [phone] [address] [place],  [place]  [address]  [facility] Get Driving Directions [phone] [address] [place], [place] [address]   [facility] [place] Get Driving Directions  [phone] [address] [place], [place] [address]   [facility] Get Driving Directions [phone] [address] [place], [place] [address]  Your MyChart E-visit questionnaire answers were reviewed by a board certified advanced clinical practitioner to complete your personal care plan based on your specific symptoms.  Thank you for using e-Visits.     I have spent 5 minutes in review of e-visit questionnaire, review and updating patient chart, medical  decision making and response to patient.   Jennifer M Burnette, PA-C  

## 2023-11-12 ENCOUNTER — Ambulatory Visit: Payer: Self-pay

## 2023-12-12 ENCOUNTER — Emergency Department

## 2023-12-12 ENCOUNTER — Other Ambulatory Visit: Payer: Self-pay

## 2023-12-12 ENCOUNTER — Emergency Department
Admission: EM | Admit: 2023-12-12 | Discharge: 2023-12-12 | Disposition: A | Discharge status: Home / Self Care | Attending: Emergency Medicine | Admitting: Emergency Medicine

## 2023-12-12 DIAGNOSIS — R2242 Localized swelling, mass and lump, left lower limb: Secondary | ICD-10-CM | POA: Diagnosis present

## 2023-12-12 DIAGNOSIS — Z7901 Long term (current) use of anticoagulants: Secondary | ICD-10-CM | POA: Diagnosis not present

## 2023-12-12 DIAGNOSIS — Z8673 Personal history of transient ischemic attack (TIA), and cerebral infarction without residual deficits: Secondary | ICD-10-CM | POA: Insufficient documentation

## 2023-12-12 DIAGNOSIS — M25572 Pain in left ankle and joints of left foot: Secondary | ICD-10-CM | POA: Insufficient documentation

## 2023-12-12 DIAGNOSIS — N189 Chronic kidney disease, unspecified: Secondary | ICD-10-CM | POA: Diagnosis not present

## 2023-12-12 NOTE — ED Provider Notes (Signed)
   [facility] Provider Note    Event Date/Time   First MD Initiated Contact with Patient 12/12/23 [phone]     (approximate)   History   Joint Swelling   HPI  Reid Regas is a 68 y.o. male  with history of DVT on Eliquis , CKD, TIA, DDD, gout and as listed in EMR presents to the emergency department for treatment and evaluation of non-traumatic right ankle pain.     Physical Exam    Vitals:   12/12/23 0835  BP: 120/82  Pulse: 94  Resp: 18  Temp: 97.9 F (36.6 C)  SpO2: 98%    General: Awake, no distress. *** CV:  Good peripheral perfusion. *** Resp:  Normal effort. *** Abd:  No distention. *** Other:  ***   ED Results / Procedures / Treatments   Labs (all labs ordered are listed, but only abnormal results are displayed)  Labs Reviewed - No data to display   EKG  ***   RADIOLOGY  Image and radiology report reviewed and interpreted by me. Radiology report consistent with the same.  ***  PROCEDURES:  Critical Care performed: {CriticalCareYesNo:19197::Yes, see critical care procedure note(s),No}  Procedures   MEDICATIONS ORDERED IN ED:  Medications - No data to display   IMPRESSION / MDM / ASSESSMENT AND PLAN / ED COURSE   I have reviewed the triage note and vital signs. Vital signs ***   Differential diagnosis includes, but is not limited to, ***  Patient's presentation is most consistent with {EM COPA:[address]}  {**The patient is on the cardiac monitor to evaluate for evidence of arrhythmia and/or significant heart rate changes.**}      FINAL CLINICAL IMPRESSION(S) / ED DIAGNOSES   Final diagnoses:  None     Rx / DC Orders   ED Discharge Orders     None        Note:  This document was prepared using Dragon voice recognition software and may include unintentional dictation errors.

## 2023-12-12 NOTE — ED Triage Notes (Signed)
 Pt states swelling to R ankle, pt states HX of gout, pt denies injury. Pt ambulatory to triage.

## 2023-12-12 NOTE — Discharge Instructions (Signed)
 Use Voltaren (diclofenac) cream as directed on the package. Do not use it for more than a week.   If your pain is not improving, or you develop new symptoms of concern, please return to the ER.

## 2024-01-21 ENCOUNTER — Encounter: Payer: Self-pay | Admitting: Nurse Practitioner

## 2024-01-21 ENCOUNTER — Ambulatory Visit (INDEPENDENT_AMBULATORY_CARE_PROVIDER_SITE_OTHER): Admitting: Nurse Practitioner

## 2024-01-21 VITALS — BP 116/68 | HR 103 | Temp 98.2°F | Resp 18 | Ht 76.0 in | Wt 207.7 lb

## 2024-01-21 DIAGNOSIS — M79671 Pain in right foot: Secondary | ICD-10-CM

## 2024-01-21 DIAGNOSIS — Z1211 Encounter for screening for malignant neoplasm of colon: Secondary | ICD-10-CM

## 2024-01-21 DIAGNOSIS — M1A9XX0 Chronic gout, unspecified, without tophus (tophi): Secondary | ICD-10-CM

## 2024-01-21 DIAGNOSIS — Z125 Encounter for screening for malignant neoplasm of prostate: Secondary | ICD-10-CM

## 2024-01-21 DIAGNOSIS — M51369 Other intervertebral disc degeneration, lumbar region without mention of lumbar back pain or lower extremity pain: Secondary | ICD-10-CM

## 2024-01-21 DIAGNOSIS — Z86718 Personal history of other venous thrombosis and embolism: Secondary | ICD-10-CM

## 2024-01-21 DIAGNOSIS — R634 Abnormal weight loss: Secondary | ICD-10-CM

## 2024-01-21 DIAGNOSIS — N182 Chronic kidney disease, stage 2 (mild): Secondary | ICD-10-CM | POA: Diagnosis not present

## 2024-01-21 DIAGNOSIS — Z8673 Personal history of transient ischemic attack (TIA), and cerebral infarction without residual deficits: Secondary | ICD-10-CM

## 2024-01-21 DIAGNOSIS — Z13220 Encounter for screening for lipoid disorders: Secondary | ICD-10-CM

## 2024-01-21 DIAGNOSIS — Z130 Encounter for screening for diseases of the blood and blood-forming organs and certain disorders involving the immune mechanism: Secondary | ICD-10-CM

## 2024-01-21 DIAGNOSIS — E663 Overweight: Secondary | ICD-10-CM

## 2024-01-21 DIAGNOSIS — Z131 Encounter for screening for diabetes mellitus: Secondary | ICD-10-CM

## 2024-01-21 MED ORDER — PREDNISONE 10 MG (21) PO TBPK: ORAL_TABLET | ORAL | 0 refills | Status: AC

## 2024-01-21 NOTE — Progress Notes (Signed)
 BP 116/68   Pulse (!) 103   Temp 98.2 F (36.8 C)   Resp 18   Ht 6' 4 (1.93 m)   Wt 207 lb 11.2 oz (94.2 kg)   SpO2 99%   BMI 25.28 kg/m    Subjective:    Patient ID: Nathaniel [name], male    DOB: [birth year], 68 y.o.   MRN: [id number]  HPI: Nathaniel [name] is a 68 y.o. male  Chief Complaint  Patient presents with   Establish Care   Foot Pain    Right foot pain,  xray showed arthritis and has significant swelling.  Has appt with rheumatologist    Discussed the use of AI scribe software for clinical note transcription with the patient, who gave verbal consent to proceed.  History of Present Illness Nathaniel [name] is a 68 year old male with gout who presents for evaluation of right foot pain.  Right foot pain and swelling - Right foot pain with migratory quality, possibly related to tendon inflammation rather than joint involvement - Swelling improves with elevation at night but recurs by end of day - No recent alcohol consumption; previously noted beer exacerbated symptoms - No mention of erythema, warmth, or fever  Gout flares and management - History of gout with two flares in the past year - Currently taking allopurinol 100 mg daily for prophylaxis - Avoids ibuprofen due to chronic kidney disease and anticoagulation - Increased water intake as part of management  Anticoagulation and thromboembolic history - History of deep vein thrombosis, last episode in 2017 - History of transient ischemic attack - Currently on Eliquis  5 mg twice daily  Chronic kidney disease - Chronic kidney disease with most recent GFR of 55 in 2022 - Avoids NSAIDs due to renal impairment and anticoagulation - Maintains adequate hydration  Musculoskeletal pain and mobility - Degenerative disc disease managed with regular exercise and vitamin B supplementation - Engages in swimming twice weekly and regular walking - Occasional overexertion leads to increased musculoskeletal pain - Exercise  helps maintain mobility and minimize pain  Recent weight loss - Weight decreased from 218 pounds to 205 pounds over two months - Weight loss attributed to dietary changes and increased physical activity  Functional status and activity level - Active in community, assisting adults with high school diplomas - Participates in physical activities with grandchildren     EXAM: Left LOWER EXTREMITY VENOUS DOPPLER ULTRASOUND   TECHNIQUE: Gray-scale sonography with compression, as well as color and duplex ultrasound, were performed to evaluate the deep venous system(s) from the level of the common femoral vein through the popliteal and proximal calf veins.   COMPARISON:  None Available.   FINDINGS: VENOUS   Normal compressibility of the common femoral, superficial femoral, and popliteal veins, as well as the visualized calf veins. Visualized portions of profunda femoral vein and great saphenous vein unremarkable. No filling defects to suggest DVT on grayscale or color Doppler imaging. Doppler waveforms show normal direction of venous flow, normal respiratory plasticity and response to augmentation.   Limited views of the contralateral common femoral vein are unremarkable.   OTHER   None.   Limitations: none   IMPRESSION: Negative.      01/21/2024   10:32 AM 01/15/2023   10:31 AM 01/17/2021    3:31 PM  Depression screen PHQ 2/9  Decreased Interest 0 0 0  Down, Depressed, Hopeless 0 0 0  PHQ - 2 Score 0 0 0  Altered sleeping 0    Tired,  decreased energy 0    Change in appetite 0    Feeling bad or failure about yourself  0    Trouble concentrating 0    Moving slowly or fidgety/restless 0    Suicidal thoughts 0    PHQ-9 Score 0    Difficult doing work/chores Not difficult at all      Relevant past medical, surgical, family and social history reviewed and updated as indicated. Interim medical history since our last visit reviewed. Allergies and medications reviewed  and updated.  Review of Systems  Ten systems reviewed and is negative except as mentioned in HPI      Objective:     BP 116/68   Pulse (!) 103   Temp 98.2 F (36.8 C)   Resp 18   Ht 6' 4 (1.93 m)   Wt 207 lb 11.2 oz (94.2 kg)   SpO2 99%   BMI 25.28 kg/m    Wt Readings from Last 3 Encounters:  01/21/24 207 lb 11.2 oz (94.2 kg)  12/12/23 207 lb 3.7 oz (94 kg)  01/15/23 208 lb (94.3 kg)    Physical Exam Physical Exam MEASUREMENTS: Weight- 207, BMI- 25.28. GENERAL: Alert, cooperative, well developed, no acute distress HEENT: Normocephalic, normal oropharynx, moist mucous membranes CHEST: Clear to auscultation bilaterally, no wheezes, rhonchi, or crackles CARDIOVASCULAR: Normal heart rate and rhythm, S1 and S2 normal without murmurs ABDOMEN: Soft, non-tender, non-distended, without organomegaly, normal bowel sounds EXTREMITIES: No cyanosis or edema NEUROLOGICAL: Cranial nerves grossly intact, moves all extremities without gross motor or sensory deficit   Results for orders placed or performed in visit on 01/17/21  COMPLETE METABOLIC PANEL WITH GFR   Collection Time: 01/17/21  4:19 PM  Result Value Ref Range   Glucose, Bld 88 65 - 99 mg/dL   BUN 14 7 - 25 mg/dL   Creat 8.58 (H) 9.29 - 1.35 mg/dL   eGFR 55 (L) > OR = 60 mL/min/1.6m2   BUN/Creatinine Ratio 10 6 - 22 (calc)   Sodium 139 135 - 146 mmol/L   Potassium 4.5 3.5 - 5.3 mmol/L   Chloride 103 98 - 110 mmol/L   CO2 28 20 - 32 mmol/L   Calcium 9.8 8.6 - 10.3 mg/dL   Total Protein 7.2 6.1 - 8.1 g/dL   Albumin 4.8 3.6 - 5.1 g/dL   Globulin 2.4 1.9 - 3.7 g/dL (calc)   AG Ratio 2.0 1.0 - 2.5 (calc)   Total Bilirubin 0.9 0.2 - 1.2 mg/dL   Alkaline phosphatase (APISO) 53 35 - 144 U/L   AST 22 10 - 35 U/L   ALT 22 9 - 46 U/L  TSH   Collection Time: 01/17/21  4:19 PM  Result Value Ref Range   TSH 1.83 0.40 - 4.50 mIU/L  Lipid panel   Collection Time: 01/17/21  4:19 PM  Result Value Ref Range   Cholesterol 173  <200 mg/dL   HDL 59 > OR = 40 mg/dL   Triglycerides 87 <849 mg/dL   LDL Cholesterol (Calc) 96 mg/dL (calc)   Total CHOL/HDL Ratio 2.9 <5.0 (calc)   Non-HDL Cholesterol (Calc) 114 <130 mg/dL (calc)  CBC with Differential/Platelet   Collection Time: 01/17/21  4:19 PM  Result Value Ref Range   WBC 3.4 (L) 3.8 - 10.8 Thousand/uL   RBC 5.05 4.20 - 5.80 Million/uL   Hemoglobin 14.8 13.2 - 17.1 g/dL   HCT 55.7 61.4 - 49.9 %   MCV 87.5 80.0 - 100.0 fL   MCH  29.3 27.0 - 33.0 pg   MCHC 33.5 32.0 - 36.0 g/dL   RDW 86.6 88.9 - 84.9 %   Platelets 198 140 - 400 Thousand/uL   MPV 10.7 7.5 - 12.5 fL   Neutro Abs 1,792 1,500 - 7,800 cells/uL   Lymphs Abs 1,316 850 - 3,900 cells/uL   Absolute Monocytes 221 200 - 950 cells/uL   Eosinophils Absolute 51 15 - 500 cells/uL   Basophils Absolute 20 0 - 200 cells/uL   Neutrophils Relative % 52.7 %   Total Lymphocyte 38.7 %   Monocytes Relative 6.5 %   Eosinophils Relative 1.5 %   Basophils Relative 0.6 %  PSA   Collection Time: 01/17/21  4:19 PM  Result Value Ref Range   PSA 2.08 < OR = 4.00 ng/mL          Assessment & Plan:   Problem List Items Addressed This Visit       Musculoskeletal and Integument   Degenerative disc disease, lumbar     Genitourinary   CKD (chronic kidney disease) stage 2, GFR 60-89 ml/min   Relevant Orders   Comprehensive metabolic panel with GFR     Other   History of DVT (deep vein thrombosis)   History of TIA (transient ischemic attack)   Relevant Orders   Lipid panel   Other Visit Diagnoses       Screening for colon cancer    -  Primary   Relevant Orders   Cologuard     Right foot pain       Relevant Medications   predniSONE  (STERAPRED UNI-PAK 21 TAB) 10 MG (21) TBPK tablet     Chronic gout without tophus, unspecified cause, unspecified site       Relevant Orders   Uric acid     Screening for cholesterol level       Relevant Orders   Lipid panel     Screening for diabetes mellitus        Relevant Orders   Comprehensive metabolic panel with GFR   Hemoglobin A1c     Screening for deficiency anemia       Relevant Orders   CBC with Differential/Platelet     Screening for prostate cancer       Relevant Orders   PSA     Overweight       Relevant Orders   TSH     Weight loss       Relevant Orders   TSH        Assessment and Plan Assessment & Plan Chronic gout with recurrent flares and right foot pain Chronic gout with recurrent flares, currently experiencing right foot pain. Recent flare possibly exacerbated by physical activity and dietary habits. Unable to use NSAIDs due to concurrent use of Eliquis . Previous treatment with colchicine  was not initiated timely, leading to prolonged symptoms. Steroid therapy was effective in reducing symptoms. Current swelling persists, likely due to overuse and arthritis. Awaiting rheumatology consultation for further management. - Use Voltaren gel topically to manage swelling and pain in the right foot. - Follow up with rheumatologist as scheduled for further management of gout.  Lumbar intervertebral disc degeneration Chronic lumbar intervertebral disc degeneration with intermittent pain. Pain is managed with exercises and vitamin B supplementation. Consistent exercise reduces pain significantly. Aware that the condition is unlikely to improve structurally but focuses on maintaining mobility and minimizing pain.  Chronic kidney disease stage 2 Chronic kidney disease stage 2 with a GFR of 55  in 2022, slightly below normal. Advised to maintain hydration and avoid nephrotoxic medications such as NSAIDs. Informed that the condition is not a major concern at this stage but requires monitoring. - Recheck kidney function to monitor GFR.  Overweight with recent abnormal weight loss Overweight with a BMI of 25.28. Recent weight loss from 218 pounds to 205 pounds over two months. Weight loss may be related to increased physical activity and  dietary changes. Thyroid function will be checked to rule out any underlying issues. - Check thyroid function to assess for potential causes of weight loss.  General Health Maintenance Routine health maintenance is due, including prostate cancer screening and colorectal cancer screening. - Order PSA test for prostate cancer screening.        Follow up plan: Return in about 1 year (around 01/20/2025) for follow up.

## 2024-01-22 ENCOUNTER — Ambulatory Visit: Payer: Self-pay | Admitting: Nurse Practitioner

## 2024-01-22 LAB — TSH: TSH: 1.7 m[IU]/L (ref 0.40–4.50)

## 2024-01-22 LAB — CBC WITH DIFFERENTIAL/PLATELET
Absolute Lymphocytes: 1126 {cells}/uL (ref 850–3900)
Absolute Monocytes: 298 {cells}/uL (ref 200–950)
Basophils Absolute: 8 {cells}/uL (ref 0–200)
Basophils Relative: 0.2 %
Eosinophils Absolute: 80 {cells}/uL (ref 15–500)
Eosinophils Relative: 1.9 %
HCT: 41.1 % (ref 38.5–50.0)
Hemoglobin: 13.5 g/dL (ref 13.2–17.1)
MCH: 29 pg (ref 27.0–33.0)
MCHC: 32.8 g/dL (ref 32.0–36.0)
MCV: 88.2 fL (ref 80.0–100.0)
MPV: 10.2 fL (ref 7.5–12.5)
Monocytes Relative: 7.1 %
Neutro Abs: 2688 {cells}/uL (ref 1500–7800)
Neutrophils Relative %: 64 %
Platelets: 300 Thousand/uL (ref 140–400)
RBC: 4.66 Million/uL (ref 4.20–5.80)
RDW: 13.1 % (ref 11.0–15.0)
Total Lymphocyte: 26.8 %
WBC: 4.2 Thousand/uL (ref 3.8–10.8)

## 2024-01-22 LAB — LIPID PANEL
Cholesterol: 140 mg/dL (ref <200)
HDL: 46 mg/dL (ref >=40)
LDL Cholesterol (Calc): 76 mg/dL
Non-HDL Cholesterol (Calc): 94 mg/dL (ref <130)
Total CHOL/HDL Ratio: 3 (calc) (ref <5.0)
Triglycerides: 95 mg/dL (ref <150)

## 2024-01-22 LAB — COMPREHENSIVE METABOLIC PANEL WITH GFR
AG Ratio: 1.4 (calc) (ref 1.0–2.5)
ALT: 18 U/L (ref 9–46)
AST: 18 U/L (ref 10–35)
Albumin: 4.3 g/dL (ref 3.6–5.1)
Alkaline phosphatase (APISO): 63 U/L (ref 35–144)
BUN: 16 mg/dL (ref 7–25)
CO2: 27 mmol/L (ref 20–32)
Calcium: 9.8 mg/dL (ref 8.6–10.3)
Chloride: 103 mmol/L (ref 98–110)
Creat: 1.06 mg/dL (ref 0.70–1.35)
Globulin: 3 g/dL (ref 1.9–3.7)
Glucose, Bld: 87 mg/dL (ref 65–99)
Potassium: 4.9 mmol/L (ref 3.5–5.3)
Sodium: 139 mmol/L (ref 135–146)
Total Bilirubin: 0.6 mg/dL (ref 0.2–1.2)
Total Protein: 7.3 g/dL (ref 6.1–8.1)
eGFR: 76 mL/min/1.73m2 (ref >=60)

## 2024-01-22 LAB — URIC ACID: Uric Acid, Serum: 6 mg/dL (ref 4.0–8.0)

## 2024-01-22 LAB — PSA: PSA: 3.62 ng/mL (ref <=4.00)

## 2024-01-22 LAB — HEMOGLOBIN A1C
Hgb A1c MFr Bld: 5.8 % — ABNORMAL HIGH (ref <5.7)
Mean Plasma Glucose: 120 mg/dL
eAG (mmol/L): 6.6 mmol/L

## 2024-01-27 ENCOUNTER — Other Ambulatory Visit: Payer: Self-pay | Admitting: Nurse Practitioner

## 2024-01-27 NOTE — Telephone Encounter (Signed)
 FYI Only or Action Required?: Action required by provider: medication refill request.  Patient was last seen in primary care on 01/21/2024 by Gareth Mliss FALCON, FNP.  Called Nurse Triage reporting No chief complaint on file..  Symptoms began today.  Interventions attempted: Nothing.  Symptoms are: stable.  Triage Disposition: No disposition on file.  Patient/caregiver understands and will follow disposition?:

## 2024-01-27 NOTE — Telephone Encounter (Unsigned)
 Copied from CRM #[id number]. Topic: Clinical - Medication Refill >> Jan 27, 2024 12:50 PM Zebedee SAUNDERS wrote: Medication: allopurinol  (ZYLOPRIM ) 100 MG tablet, Colchicine    Has the patient contacted their pharmacy? Yes (Agent: If no, request that the patient contact the pharmacy for the refill. If patient does not wish to contact the pharmacy document the reason why and proceed with request.) (Agent: If yes, when and what did the pharmacy advise?)  This is the patient's preferred pharmacy:  [id number] [place], [place] - [address] AT [facility] OF [address] [place] [address] Phone: [phone] Fax: [phone]  Is this the correct pharmacy for this prescription? Yes If no, delete pharmacy and type the correct one.   Has the prescription been filled recently? Yes  Is the patient out of the medication? Yes  Has the patient been seen for an appointment in the last year OR does the patient have an upcoming appointment? Yes  Can we respond through MyChart? Yes  Agent: Please be advised that Rx refills may take up to 3 business days. We ask that you follow-up with your pharmacy.

## 2024-01-28 MED ORDER — ALLOPURINOL 100 MG PO TABS: 100.0000 mg | ORAL_TABLET | Freq: Every day | ORAL | 3 refills | Status: AC

## 2024-01-28 NOTE — Telephone Encounter (Signed)
 Historical provider.

## 2024-01-28 NOTE — Telephone Encounter (Signed)
 Requested medications are due for refill today.  unsure  Requested medications are on the active medications list.  yes  Last refill. 12/02/2023   Future visit scheduled.   yes  Notes to clinic.  Medication is historical.    Requested Prescriptions  Pending Prescriptions Disp Refills   allopurinol  (ZYLOPRIM ) 100 MG tablet      Sig: Take 1 tablet (100 mg total) by mouth daily.     Endocrinology:  Gout Agents - allopurinol  Passed - 01/28/2024 10:21 AM      Passed - Uric Acid in normal range and within 360 days    Uric Acid, Serum  Date Value Ref Range Status  01/21/2024 6.0 4.0 - 8.0 mg/dL Final    Comment:    Therapeutic target for gout patients: <6.0 mg/dL .          Passed - Cr in normal range and within 360 days    Creat  Date Value Ref Range Status  01/21/2024 1.06 0.70 - 1.35 mg/dL Final         Passed - Valid encounter within last 12 months    Recent Outpatient Visits           1 week ago Screening for colon cancer   [facility] Gareth Mliss FALCON, FNP       Future Appointments             In 12 months Gareth, Mliss FALCON, FNP [facility] [place]            Passed - CBC within normal limits and completed in the last 12 months    WBC  Date Value Ref Range Status  01/21/2024 4.2 3.8 - 10.8 Thousand/uL Final   RBC  Date Value Ref Range Status  01/21/2024 4.66 4.20 - 5.80 Million/uL Final   Hemoglobin  Date Value Ref Range Status  01/21/2024 13.5 13.2 - 17.1 g/dL Final  97/91/7982 85.1 12.6 - 17.7 g/dL Final   HCT  Date Value Ref Range Status  01/21/2024 41.1 38.5 - 50.0 % Final   Hematocrit  Date Value Ref Range Status  07/04/2015 43.3 37.5 - 51.0 % Final   MCHC  Date Value Ref Range Status  01/21/2024 32.8 32.0 - 36.0 g/dL Final    Comment:    For adults, a slight decrease in the calculated MCHC value (in the range of 30 to 32 g/dL) is most likely not clinically significant; however, it  should be interpreted with caution in correlation with other red cell parameters and the patient's clinical condition.    [facility]  Date Value Ref Range Status  01/21/2024 29.0 27.0 - 33.0 pg Final   MCV  Date Value Ref Range Status  01/21/2024 88.2 80.0 - 100.0 fL Final  07/04/2015 86 79 - 97 fL Final   No results found for: PLTCOUNTKUC, LABPLAT, POCPLA RDW  Date Value Ref Range Status  01/21/2024 13.1 11.0 - 15.0 % Final  07/04/2015 14.6 12.3 - 15.4 % Final

## 2025-01-23 ENCOUNTER — Ambulatory Visit: Admitting: Nurse Practitioner
